# Patient Record
Sex: Female | Born: 1975 | Race: White | Hispanic: No | Marital: Married | Smoking: Current some day smoker
Health system: Southern US, Community
[De-identification: ages and names within clinical notes are randomized; demographics above are authoritative.]

## PROBLEM LIST (undated history)

## (undated) ENCOUNTER — Emergency Department (HOSPITAL_COMMUNITY): Admission: EM | Payer: Self-pay | Source: Home / Self Care

## (undated) DIAGNOSIS — Z5189 Encounter for other specified aftercare: Secondary | ICD-10-CM

## (undated) DIAGNOSIS — J45909 Unspecified asthma, uncomplicated: Secondary | ICD-10-CM

## (undated) DIAGNOSIS — K274 Chronic or unspecified peptic ulcer, site unspecified, with hemorrhage: Secondary | ICD-10-CM

## (undated) DIAGNOSIS — K284 Chronic or unspecified gastrojejunal ulcer with hemorrhage: Secondary | ICD-10-CM

## (undated) DIAGNOSIS — K449 Diaphragmatic hernia without obstruction or gangrene: Secondary | ICD-10-CM

## (undated) DIAGNOSIS — I10 Essential (primary) hypertension: Secondary | ICD-10-CM

## (undated) DIAGNOSIS — D649 Anemia, unspecified: Secondary | ICD-10-CM

## (undated) HISTORY — PX: ABDOMINAL HYSTERECTOMY: SHX81

## (undated) HISTORY — PX: TUBAL LIGATION: SHX77

---

## 1997-10-01 ENCOUNTER — Encounter: Admission: RE | Admit: 1997-10-01 | Discharge: 1997-12-30 | Payer: Self-pay | Admitting: Specialist

## 1999-04-27 ENCOUNTER — Other Ambulatory Visit: Admission: RE | Admit: 1999-04-27 | Discharge: 1999-04-27 | Payer: Self-pay | Admitting: Gynecology

## 2000-05-08 ENCOUNTER — Emergency Department (HOSPITAL_COMMUNITY): Admission: EM | Admit: 2000-05-08 | Discharge: 2000-05-08 | Payer: Self-pay | Admitting: Emergency Medicine

## 2000-05-08 ENCOUNTER — Encounter: Payer: Self-pay | Admitting: Emergency Medicine

## 2001-03-15 ENCOUNTER — Encounter: Payer: Self-pay | Admitting: Emergency Medicine

## 2001-03-15 ENCOUNTER — Emergency Department (HOSPITAL_COMMUNITY): Admission: EM | Admit: 2001-03-15 | Discharge: 2001-03-15 | Payer: Self-pay | Admitting: Emergency Medicine

## 2016-04-07 ENCOUNTER — Emergency Department (HOSPITAL_COMMUNITY): Payer: BLUE CROSS/BLUE SHIELD

## 2016-04-07 ENCOUNTER — Encounter (HOSPITAL_COMMUNITY): Payer: Self-pay

## 2016-04-07 ENCOUNTER — Emergency Department (HOSPITAL_COMMUNITY)
Admission: EM | Admit: 2016-04-07 | Discharge: 2016-04-07 | Disposition: A | Discharge status: Home / Self Care | Payer: BLUE CROSS/BLUE SHIELD | Attending: Emergency Medicine | Admitting: Emergency Medicine

## 2016-04-07 DIAGNOSIS — R1084 Generalized abdominal pain: Secondary | ICD-10-CM

## 2016-04-07 DIAGNOSIS — F17290 Nicotine dependence, other tobacco product, uncomplicated: Secondary | ICD-10-CM | POA: Diagnosis not present

## 2016-04-07 DIAGNOSIS — R5383 Other fatigue: Secondary | ICD-10-CM

## 2016-04-07 DIAGNOSIS — Z79899 Other long term (current) drug therapy: Secondary | ICD-10-CM | POA: Diagnosis not present

## 2016-04-07 DIAGNOSIS — I10 Essential (primary) hypertension: Secondary | ICD-10-CM | POA: Diagnosis not present

## 2016-04-07 DIAGNOSIS — K921 Melena: Secondary | ICD-10-CM | POA: Diagnosis not present

## 2016-04-07 DIAGNOSIS — E876 Hypokalemia: Secondary | ICD-10-CM | POA: Insufficient documentation

## 2016-04-07 DIAGNOSIS — J45909 Unspecified asthma, uncomplicated: Secondary | ICD-10-CM | POA: Diagnosis not present

## 2016-04-07 HISTORY — DX: Chronic or unspecified peptic ulcer, site unspecified, with hemorrhage: K27.4

## 2016-04-07 HISTORY — DX: Anemia, unspecified: D64.9

## 2016-04-07 HISTORY — DX: Chronic or unspecified gastrojejunal ulcer with hemorrhage: K28.4

## 2016-04-07 HISTORY — DX: Encounter for other specified aftercare: Z51.89

## 2016-04-07 HISTORY — DX: Unspecified asthma, uncomplicated: J45.909

## 2016-04-07 HISTORY — DX: Diaphragmatic hernia without obstruction or gangrene: K44.9

## 2016-04-07 HISTORY — DX: Essential (primary) hypertension: I10

## 2016-04-07 LAB — COMPREHENSIVE METABOLIC PANEL
ALBUMIN: 4.1 g/dL (ref 3.5–5.0)
ALK PHOS: 64 U/L (ref 38–126)
ALT: 20 U/L (ref 14–54)
ANION GAP: 7 (ref 5–15)
AST: 21 U/L (ref 15–41)
BILIRUBIN TOTAL: 0.4 mg/dL (ref 0.3–1.2)
BUN: 17 mg/dL (ref 6–20)
CALCIUM: 9.1 mg/dL (ref 8.9–10.3)
CO2: 28 mmol/L (ref 22–32)
Chloride: 102 mmol/L (ref 101–111)
Creatinine, Ser: 0.96 mg/dL (ref 0.44–1.00)
GFR calc non Af Amer: >60 mL/min (ref >60)
GLUCOSE: 113 mg/dL — AB (ref 65–99)
POTASSIUM: 3.1 mmol/L — AB (ref 3.5–5.1)
SODIUM: 137 mmol/L (ref 135–145)
TOTAL PROTEIN: 7.9 g/dL (ref 6.5–8.1)

## 2016-04-07 LAB — CBC
HEMATOCRIT: 39.3 % (ref 36.0–46.0)
HEMOGLOBIN: 12.6 g/dL (ref 12.0–15.0)
MCH: 27.5 pg (ref 26.0–34.0)
MCHC: 32.1 g/dL (ref 30.0–36.0)
MCV: 85.6 fL (ref 78.0–100.0)
Platelets: 332 10*3/uL (ref 150–400)
RBC: 4.59 MIL/uL (ref 3.87–5.11)
RDW: 14.3 % (ref 11.5–15.5)
WBC: 9.3 10*3/uL (ref 4.0–10.5)

## 2016-04-07 LAB — URINALYSIS, ROUTINE W REFLEX MICROSCOPIC
BILIRUBIN URINE: NEGATIVE
GLUCOSE, UA: NEGATIVE mg/dL
HGB URINE DIPSTICK: NEGATIVE
KETONES UR: NEGATIVE mg/dL
Leukocytes, UA: NEGATIVE
Nitrite: NEGATIVE
PROTEIN: NEGATIVE mg/dL
Specific Gravity, Urine: 1.013 (ref 1.005–1.030)
pH: 6.5 (ref 5.0–8.0)

## 2016-04-07 LAB — ABO/RH: ABO/RH(D): A NEG

## 2016-04-07 LAB — LIPASE, BLOOD: LIPASE: 30 U/L (ref 11–51)

## 2016-04-07 LAB — POC OCCULT BLOOD, ED: FECAL OCCULT BLD: NEGATIVE

## 2016-04-07 LAB — TYPE AND SCREEN
ABO/RH(D): A NEG
ANTIBODY SCREEN: NEGATIVE

## 2016-04-07 MED ORDER — POTASSIUM CHLORIDE CRYS ER 20 MEQ PO TBCR
40.0000 meq | EXTENDED_RELEASE_TABLET | Freq: Once | ORAL | Status: AC
  Administered 2016-04-07: 40 meq via ORAL
  Filled 2016-04-07: qty 2

## 2016-04-07 MED ORDER — IOPAMIDOL (ISOVUE-300) INJECTION 61%
100.0000 mL | Freq: Once | INTRAVENOUS | Status: AC | PRN
  Administered 2016-04-07: 100 mL via INTRAVENOUS

## 2016-04-07 MED ORDER — POTASSIUM CHLORIDE ER 10 MEQ PO TBCR: 20.0000 meq | EXTENDED_RELEASE_TABLET | Freq: Every day | ORAL | 0 refills | Status: DC

## 2016-04-07 MED ORDER — SODIUM CHLORIDE 0.9 % IV BOLUS (SEPSIS)
1000.0000 mL | Freq: Once | INTRAVENOUS | Status: AC
  Administered 2016-04-07: 1000 mL via INTRAVENOUS

## 2016-04-07 NOTE — ED Notes (Signed)
Pt states she is very dizzy and is unable to walk to bathroom. STEDY used

## 2016-04-07 NOTE — ED Provider Notes (Signed)
[facility]-EMERGENCY DEPT Provider Note   CSN: [id number] Arrival date & time: 04/07/16  1433     History   Chief Complaint Chief Complaint  Patient presents with  . Abdominal Pain  . Weakness  . Dizziness    HPI Carmen Ramos is a 40 y.o. female.  The history is provided by the patient.  Abdominal Pain   This is a recurrent problem. The current episode started more than 2 days ago. The problem occurs constantly (fluctuating). The problem has not changed since onset.The pain is located in the generalized abdominal region. The pain is moderate. Associated symptoms include melena. Pertinent negatives include anorexia, fever, diarrhea, flatus, hematochezia, vomiting, constipation, dysuria, frequency, hematuria, arthralgias and myalgias. Nothing aggravates the symptoms. Nothing relieves the symptoms. Past workup comments: Known bleeding ulcers on iron supplement. Patient is supposedly on protonic however recently moved to [place] and ran out of her prescription. Has been out of protonic for about 3 weeks.. Her past medical history is significant for PUD.  Weakness  Primary symptoms include dizziness. Pertinent negatives include no shortness of breath, no chest pain and no vomiting.  Dizziness  Associated symptoms: weakness   Associated symptoms: no chest pain, no diarrhea, no palpitations, no shortness of breath and no vomiting     Past Medical History:  Diagnosis Date  . Anemia   . Asthma   . Bleeding ulcer   . Blood transfusion without reported diagnosis    6  . Hiatal hernia   . Hypertension     There are no active problems to display for this patient.   Past Surgical History:  Procedure Laterality Date  . ABDOMINAL HYSTERECTOMY    . CESAREAN SECTION    . TUBAL LIGATION      OB History    No data available       Home Medications    Prior to Admission medications   Medication Sig Start Date End Date Taking? Authorizing Provider  Cyanocobalamin (VITAMIN  B-12 PO) Take 1 tablet by mouth daily.   Yes Historical Provider, MD  ferrous sulfate 325 (65 FE) MG tablet Take 325 mg by mouth 3 (three) times daily with meals.   Yes Historical Provider, MD  lisinopril-hydrochlorothiazide (PRINZIDE,ZESTORETIC) 20-25 MG tablet Take 1 tablet by mouth daily.   Yes Historical Provider, MD  pantoprazole (PROTONIX) 40 MG tablet Take 40 mg by mouth 2 (two) times daily.   Yes Historical Provider, MD  potassium chloride (K-DUR) 10 MEQ tablet Take 2 tablets (20 mEq total) by mouth daily. 04/07/16 04/14/16  Nira Conn, MD    Family History No family history on file.  Social History Social History  Substance Use Topics  . Smoking status: Current Some Day Smoker    Types: Cigars  . Smokeless tobacco: Never Used  . Alcohol use No     Allergies   Gluten meal and Latex   Review of Systems Review of Systems  Constitutional: Positive for fatigue. Negative for chills and fever.  HENT: Negative for ear pain and sore throat.   Eyes: Negative for pain and visual disturbance.  Respiratory: Negative for cough and shortness of breath.   Cardiovascular: Negative for chest pain and palpitations.  Gastrointestinal: Positive for abdominal pain and melena. Negative for anorexia, constipation, diarrhea, flatus, hematochezia and vomiting.  Genitourinary: Negative for dysuria, frequency and hematuria.  Musculoskeletal: Negative for arthralgias, back pain and myalgias.  Skin: Negative for color change and rash.  Neurological: Positive for dizziness and weakness. Negative  for seizures and syncope.  All other systems reviewed and are negative.    Physical Exam Updated Vital Signs BP 114/90 (BP Location: Right Arm)   Pulse 98   Temp 98.3 F (36.8 C) (Oral)   Resp 18   Ht 5\' 3"  (1.6 m)   Wt 248 lb (112.5 kg)   SpO2 99%   BMI 43.93 kg/m   Physical Exam  Constitutional: She is oriented to person, place, and time. She appears well-developed and  well-nourished. No distress.  HENT:  Head: Normocephalic and atraumatic.  Nose: Nose normal.  Mouth/Throat: Mucous membranes are dry.  Eyes: Conjunctivae and EOM are normal. Pupils are equal, round, and reactive to light. Right eye exhibits no discharge. Left eye exhibits no discharge. No scleral icterus.  Neck: Normal range of motion. Neck supple.  Cardiovascular: Normal rate and regular rhythm.  Exam reveals no gallop and no friction rub.   No murmur heard. Pulmonary/Chest: Effort normal and breath sounds normal. No stridor. No respiratory distress. She has no rales.  Abdominal: Soft. She exhibits no distension. There is generalized tenderness. There is guarding. There is no rigidity, no rebound and no CVA tenderness.  Musculoskeletal: She exhibits no edema or tenderness.  Neurological: She is alert and oriented to person, place, and time.  Skin: Skin is warm and dry. No rash noted. She is not diaphoretic. No erythema.  Psychiatric: She has a normal mood and affect.  Vitals reviewed.    ED Treatments / Results  Labs (all labs ordered are listed, but only abnormal results are displayed) Labs Reviewed  COMPREHENSIVE METABOLIC PANEL - Abnormal; Notable for the following:       Result Value   Potassium 3.1 (*)    Glucose, Bld 113 (*)    All other components within normal limits  CBC  LIPASE, BLOOD  URINALYSIS, ROUTINE W REFLEX MICROSCOPIC (NOT AT [facility])  POC OCCULT BLOOD, ED  TYPE AND SCREEN  ABO/RH    EKG  EKG Interpretation None       Radiology Ct Abdomen Pelvis W Contrast  Result Date: 04/07/2016 CLINICAL DATA:  Cramping abdominal pain, weakness and lethargy. EXAM: CT ABDOMEN AND PELVIS WITH CONTRAST TECHNIQUE: Multidetector CT imaging of the abdomen and pelvis was performed using the standard protocol following bolus administration of intravenous contrast. CONTRAST:  ISOVUE-300 IOPAMIDOL (ISOVUE-300) INJECTION 61% COMPARISON:  None. FINDINGS: LOWER CHEST: Lung  bases are clear. Included heart size is normal. No pericardial effusion. Moderate-sized hiatal hernia. HEPATOBILIARY: Liver and gallbladder are normal. PANCREAS: Normal. SPLEEN: Normal. ADRENALS/URINARY TRACT: Kidneys are orthotopic, demonstrating symmetric enhancement. No nephrolithiasis, hydronephrosis or solid renal masses. The unopacified ureters are normal in course and caliber. Delayed imaging through the kidneys demonstrates symmetric prompt contrast excretion within the proximal urinary collecting system. Urinary bladder is partially distended and unremarkable. Normal adrenal glands. STOMACH/BOWEL: The stomach, small and large bowel are normal in course and caliber without inflammatory changes. Scattered diverticulosis without acute diverticulitis. Appendix is not visualized but no pericecal inflammatory process identified. VASCULAR/LYMPHATIC: Aortoiliac vessels are normal in course and caliber. No lymphadenopathy by CT size criteria. REPRODUCTIVE: Hysterectomy.  No adnexal abnormality. OTHER: No intraperitoneal free fluid or free air. MUSCULOSKELETAL: L4-5 facet arthropathy. IMPRESSION: Moderate-sized hiatal hernia. L4-5 degenerative facet arthropathy. Hysterectomy. Electronically Signed   By: Tollie Eth M.D.   On: 04/07/2016 18:48    Procedures Procedures (including critical care time)  Medications Ordered in ED Medications  potassium chloride SA (K-DUR,KLOR-CON) CR tablet 40 mEq (not administered)  sodium chloride 0.9 % bolus 1,000 mL (0 mLs Intravenous Stopped 04/07/16 2003)  iopamidol (ISOVUE-300) 61 % injection 100 mL (100 mLs Intravenous Contrast Given 04/07/16 1830)     Initial Impression / Assessment and Plan / ED Course  I have reviewed the triage vital signs and the nursing notes.  Pertinent labs & imaging results that were available during my care of the patient were reviewed by me and considered in my medical decision making (see chart for details).  Clinical Course    Labs  without anemia. CMP with mild hypokalemia. Patient given oral replacement. No other electrolyte derangements. No evidence of hypoglycemia. No infectious symptoms on history. CT abdomen was obtained given the patient's diffuse abdominal tenderness to rule out possible perforation. CT unremarkable for any inflammatory infectious process.  Negative orthostatics. Patient given IV fluids which had some improvement in her fatigue.  Patient reports that she is already has an appointment with hematology in 2 weeks for her chronic iron deficiency anemia. Patient needs GI follow-up for her reported peptic ulcer disease.  Safe for discharge with strict return precautions.  Final Clinical Impressions(s) / ED Diagnoses   Final diagnoses:  Hypokalemia  Other fatigue  Generalized abdominal pain   Disposition: Discharge  Condition: Good  I have discussed the results, Dx and Tx plan with the patient who expressed understanding and agree(s) with the plan. Discharge instructions discussed at great length. The patient was given strict return precautions who verbalized understanding of the instructions. No further questions at time of discharge.    New Prescriptions   POTASSIUM CHLORIDE (K-DUR) 10 MEQ TABLET    Take 2 tablets (20 mEq total) by mouth daily.    Follow Up: [facility] [address] [place] [id number]  Schedule an appointment as soon as possible for a visit  For close follow up to assess for reported peptic ulcers  [facility] [address] [place] [id number] [phone] Call  For help establishing care with a care provider      Nira ConnPedro Eduardo Cardama, MD 04/07/16 2015

## 2016-04-07 NOTE — ED Notes (Signed)
MD at bedside. EDP PEDRO 

## 2016-04-07 NOTE — ED Notes (Signed)
Pt near syncope with blood draw. Pt transferred to room. Carmen Ramos collecting VS and assessment at present time.

## 2016-04-07 NOTE — ED Notes (Signed)
Pt did fine for lab draw -  Became very light headed right after.  Cool rag placed on back of neck and RN notified.  Pt friend states that she has been very week for a few days now.

## 2016-04-07 NOTE — ED Notes (Signed)
Pt reports blood in emesis is both bright red and coffee ground in color.

## 2016-04-07 NOTE — ED Notes (Signed)
Patient d/c'd self care.  F/U and medications reviewed.  Patient verbalized understanding. 

## 2016-04-07 NOTE — ED Notes (Signed)
Carmen Ramos HUSBAND CONTACT INFORMATION CELL # [phone]

## 2016-04-07 NOTE — Progress Notes (Signed)
Patient listed as having [employer] insurance without a pcp.  EDCM spoke to patient at bedside.  [place]  EDCm provided patient with a list of providers who accept [employer] insurance within with a 20 mile radius of patient's zip code.  Encouraged patient to establish care as soon as possible.  Patient thankful for resources.  No further EDCM needs at this time.

## 2016-04-07 NOTE — ED Triage Notes (Signed)
Pt presents to the ED c/o cramping abd pain, weakness, lethargy, and dizziness x 3 days. Pt also reports feeling shaky, tachycardic, and had an episode of hematemesis. Pt reports hx of bleeding ulcers and states "whenever she feels like this she has ended up getting a blood transfusion". Denies SOB or CP. Pulses 2+, cap refill <3 secs.

## 2016-04-26 DIAGNOSIS — F419 Anxiety disorder, unspecified: Secondary | ICD-10-CM | POA: Insufficient documentation

## 2016-08-17 DIAGNOSIS — K219 Gastro-esophageal reflux disease without esophagitis: Secondary | ICD-10-CM | POA: Insufficient documentation

## 2016-12-31 ENCOUNTER — Encounter (HOSPITAL_COMMUNITY): Payer: Self-pay | Admitting: Emergency Medicine

## 2016-12-31 ENCOUNTER — Emergency Department (HOSPITAL_COMMUNITY): Payer: BLUE CROSS/BLUE SHIELD

## 2016-12-31 ENCOUNTER — Emergency Department (HOSPITAL_COMMUNITY)
Admission: EM | Admit: 2016-12-31 | Discharge: 2016-12-31 | Disposition: A | Discharge status: Home / Self Care | Payer: BLUE CROSS/BLUE SHIELD | Attending: Emergency Medicine | Admitting: Emergency Medicine

## 2016-12-31 ENCOUNTER — Other Ambulatory Visit: Payer: Self-pay

## 2016-12-31 DIAGNOSIS — I10 Essential (primary) hypertension: Secondary | ICD-10-CM | POA: Insufficient documentation

## 2016-12-31 DIAGNOSIS — R42 Dizziness and giddiness: Secondary | ICD-10-CM | POA: Diagnosis not present

## 2016-12-31 DIAGNOSIS — Z79899 Other long term (current) drug therapy: Secondary | ICD-10-CM | POA: Insufficient documentation

## 2016-12-31 DIAGNOSIS — M791 Myalgia: Secondary | ICD-10-CM | POA: Diagnosis not present

## 2016-12-31 DIAGNOSIS — R109 Unspecified abdominal pain: Secondary | ICD-10-CM | POA: Insufficient documentation

## 2016-12-31 DIAGNOSIS — R002 Palpitations: Secondary | ICD-10-CM | POA: Diagnosis not present

## 2016-12-31 DIAGNOSIS — F17290 Nicotine dependence, other tobacco product, uncomplicated: Secondary | ICD-10-CM | POA: Diagnosis not present

## 2016-12-31 DIAGNOSIS — R0789 Other chest pain: Secondary | ICD-10-CM

## 2016-12-31 DIAGNOSIS — J45909 Unspecified asthma, uncomplicated: Secondary | ICD-10-CM | POA: Diagnosis not present

## 2016-12-31 DIAGNOSIS — R5383 Other fatigue: Secondary | ICD-10-CM | POA: Diagnosis not present

## 2016-12-31 DIAGNOSIS — R110 Nausea: Secondary | ICD-10-CM | POA: Diagnosis not present

## 2016-12-31 LAB — BASIC METABOLIC PANEL
Anion gap: 12 (ref 5–15)
BUN: 18 mg/dL (ref 6–20)
CALCIUM: 9.5 mg/dL (ref 8.9–10.3)
CHLORIDE: 101 mmol/L (ref 101–111)
CO2: 26 mmol/L (ref 22–32)
CREATININE: 0.84 mg/dL (ref 0.44–1.00)
Glucose, Bld: 94 mg/dL (ref 65–99)
Potassium: 3.2 mmol/L — ABNORMAL LOW (ref 3.5–5.1)
SODIUM: 139 mmol/L (ref 135–145)

## 2016-12-31 LAB — CBC
HCT: 34.2 % — ABNORMAL LOW (ref 36.0–46.0)
Hemoglobin: 10.8 g/dL — ABNORMAL LOW (ref 12.0–15.0)
MCH: 24.7 pg — ABNORMAL LOW (ref 26.0–34.0)
MCHC: 31.6 g/dL (ref 30.0–36.0)
MCV: 78.1 fL (ref 78.0–100.0)
PLATELETS: 350 10*3/uL (ref 150–400)
RBC: 4.38 MIL/uL (ref 3.87–5.11)
RDW: 15.8 % — ABNORMAL HIGH (ref 11.5–15.5)
WBC: 10.3 10*3/uL (ref 4.0–10.5)

## 2016-12-31 LAB — POCT I-STAT TROPONIN I: TROPONIN I, POC: 0 ng/mL (ref 0.00–0.08)

## 2016-12-31 LAB — RAPID URINE DRUG SCREEN, HOSP PERFORMED
Amphetamines: NOT DETECTED
Barbiturates: NOT DETECTED
Benzodiazepines: NOT DETECTED
Cocaine: NOT DETECTED
Opiates: NOT DETECTED
Tetrahydrocannabinol: NOT DETECTED

## 2016-12-31 LAB — D-DIMER, QUANTITATIVE: D-Dimer, Quant: <0.27 ug/mL-FEU (ref 0.00–0.50)

## 2016-12-31 LAB — TSH: TSH: 0.912 u[IU]/mL (ref 0.350–4.500)

## 2016-12-31 MED ORDER — ASPIRIN 81 MG PO CHEW
324.0000 mg | CHEWABLE_TABLET | Freq: Once | ORAL | Status: DC
  Filled 2016-12-31: qty 4

## 2016-12-31 MED ORDER — GI COCKTAIL ~~LOC~~
30.0000 mL | Freq: Once | ORAL | Status: AC
  Administered 2016-12-31: 30 mL via ORAL
  Filled 2016-12-31: qty 30

## 2016-12-31 MED ORDER — SODIUM CHLORIDE 0.9 % IV BOLUS (SEPSIS)
1000.0000 mL | Freq: Once | INTRAVENOUS | Status: AC
  Administered 2016-12-31: 1000 mL via INTRAVENOUS

## 2016-12-31 NOTE — Discharge Instructions (Signed)
Continue taking your medications as prescribed. Apply ice or heat to painful areas for comfort. Take hot showers or baths with gentle stretching for muscle relaxation. Follow-up with your primary care physician for reevaluation and possible Holter monitor. Return to the ED if any concerning signs or symptoms develop

## 2016-12-31 NOTE — ED Notes (Signed)
Provider at bedside

## 2016-12-31 NOTE — ED Triage Notes (Addendum)
Patient reports over past 2 weeks having heart palpitations and mid to left chest pain.  Patient states that she also has lower left abd pain with nausea. Patient reports that she is anemic and been battling with iron supplements to keep her counts up.

## 2016-12-31 NOTE — ED Notes (Signed)
Pt reported that during orthostatic vital signs she felt like the room went black and was spinning. Tech at bedside advised pt remained alert during episode.

## 2016-12-31 NOTE — ED Provider Notes (Signed)
[facility]-EMERGENCY DEPT Provider Note   CSN: [id number] Arrival date & time: 12/31/16  1820     History   Chief Complaint Chief Complaint  Patient presents with  . Palpitations  . Chest Pain    HPI Carmen Ramos is a 41 y.o. female with past medical history of anemia, asthma, GERD, HTN, and hiatal hernia who presents a with chief complaint gradual onset, worsening multiple episodes of heart palpitations with associated chest pain, abdominal pain, and nausea for 1 week. She states that she has reports of 10 episodes that last for a few minutes at a time in which she feels left-sided chest tightness followed by heart palpitations. At times this is accompanied by upper abdominal pain and nausea. No aggravating or alleviating factors noted. She has not tried anything for her symptoms. She states that she is chronically anemic, and has required blood transfusions in the past. She endorses increased fatigue and generalized myalgias as well as some lightheadedness with position changes. She denies recent travel or surgeries, hemoptysis, estrogen therapy, or prior history of DVT or PE.She is a nonsmoker.   HPI provided by the patient.   Past Medical History:  Diagnosis Date  . Anemia   . Asthma   . Bleeding ulcer   . Blood transfusion without reported diagnosis    6  . Hiatal hernia   . Hypertension     There are no active problems to display for this patient.   Past Surgical History:  Procedure Laterality Date  . ABDOMINAL HYSTERECTOMY    . CESAREAN SECTION    . TUBAL LIGATION      OB History    No data available       Home Medications    Prior to Admission medications   Medication Sig Start Date End Date Taking? Authorizing Provider  cetirizine (ZYRTEC) 10 MG tablet Take 10 mg by mouth daily as needed for allergies.   Yes [provider]  Cyanocobalamin (VITAMIN B-12 PO) Take 1 tablet by mouth daily.   Yes [provider]  ferrous sulfate 325 (65 FE) MG  tablet Take 325 mg by mouth 3 (three) times daily with meals.   Yes [provider]  lisinopril-hydrochlorothiazide (PRINZIDE,ZESTORETIC) 20-25 MG tablet Take 1 tablet by mouth daily.   Yes [provider]  pantoprazole (PROTONIX) 40 MG tablet Take 40 mg by mouth 2 (two) times daily.   Yes [provider]  Vitamin D, Ergocalciferol, (DRISDOL) 50000 units CAPS capsule Take 50,000 Units by mouth every 7 (seven) days.  12/06/16 03/06/17 Yes [provider]  potassium chloride (K-DUR) 10 MEQ tablet Take 2 tablets (20 mEq total) by mouth daily. 04/07/16 04/14/16  Nira Conn, MD    Family History No family history on file.  Social History Social History  Substance Use Topics  . Smoking status: Current Some Day Smoker    Types: Cigars  . Smokeless tobacco: Never Used  . Alcohol use No     Allergies   Gluten meal and Latex   Review of Systems Review of Systems  Constitutional: Positive for fatigue. Negative for chills, diaphoresis and fever.  Respiratory: Positive for chest tightness. Negative for shortness of breath.   Cardiovascular: Positive for chest pain and palpitations. Negative for leg swelling.  Gastrointestinal: Positive for abdominal pain and nausea. Negative for constipation and diarrhea.  Genitourinary: Negative for dysuria and hematuria.  Musculoskeletal: Positive for myalgias.  Neurological: Positive for weakness (generalized, chronic, unchanged) and light-headedness. Negative for  syncope.     Physical Exam Updated Vital Signs BP (!) 165/108 (BP Location: Left Arm)   Pulse (!) 106   Temp 98.5 F (36.9 C) (Oral)   Resp 18   Ht 5\' 3"  (1.6 m)   SpO2 98%   Physical Exam  Constitutional: She is oriented to person, place, and time. She appears well-developed and well-nourished. No distress.  Resting comfortably in bed  HENT:  Head: Normocephalic and atraumatic.  Eyes: Conjunctivae are normal. Pupils are equal, round, and  reactive to light. Right eye exhibits no discharge. Left eye exhibits no discharge. No scleral icterus.  Neck: Normal range of motion. Neck supple. No JVD present.  Cardiovascular: Regular rhythm and intact distal pulses.   Tachycardic, 2+ radial pulses bilaterally and 2+ DP/PT pulses, Homans negative bilaterally  Pulmonary/Chest: Effort normal and breath sounds normal. No respiratory distress. She exhibits tenderness.  Equal rise and fall of chest, no increased work of breathing. Generalized tenderness to palpation of the chest wall, maximally on the left side parasternally anteriorly  Abdominal: Soft. She exhibits no distension. There is tenderness. There is no rebound and no guarding.  Generalized tenderness to palpation of the abdomen, maximally in the upper abdomen. Murphy's absent, Rovsing's absent, no tenderness to palpation at McBurney's point  Musculoskeletal: She exhibits tenderness. She exhibits no edema.  Generalized mild ttp of extremities, back, and paraspinal regions. No deformity, crepitus, or step-off noted.   Lymphadenopathy:    She has no cervical adenopathy.  Neurological: She is alert and oriented to person, place, and time. No cranial nerve deficit or sensory deficit.  Fluent speech, no facial droop, sensation intact to soft touch globally, CN III-XII tested and intact  Skin: Skin is warm and dry. Capillary refill takes less than 2 seconds. She is not diaphoretic.  Psychiatric: She has a normal mood and affect. Her behavior is normal.     ED Treatments / Results  Labs (all labs ordered are listed, but only abnormal results are displayed) Labs Reviewed  BASIC METABOLIC PANEL - Abnormal; Notable for the following:       Result Value   Potassium 3.2 (*)    All other components within normal limits  CBC - Abnormal; Notable for the following:    Hemoglobin 10.8 (*)    HCT 34.2 (*)    MCH 24.7 (*)    RDW 15.8 (*)    All other components within normal limits  I-STAT  TROPOININ, ED    EKG  EKG Interpretation None       Radiology Dg Chest 2 View  Result Date: 12/31/2016 CLINICAL DATA:  41 year old female with 2 weeks of palpitations and chest pain radiating to the left. Left abdominal pain and nausea. EXAM: CHEST  2 VIEW COMPARISON:  None available. FINDINGS: Somewhat low lung volumes. Normal cardiac size and mediastinal contours. Visualized tracheal air column is within normal limits. No pneumothorax, pulmonary edema or pleural effusion. Mild streaky lung base opacity greater on the left. No other confluent pulmonary opacity. No pneumoperitoneum. Paucity of gas in the abdomen, but the visible gas pattern is unremarkable. No acute osseous abnormality identified. IMPRESSION: 1. Somewhat low lung volumes with mild streaky lung base opacity favored to be atelectasis. 2. Otherwise negative. Electronically Signed   By: Odessa Fleming M.D.   On: 12/31/2016 19:16    Procedures Procedures (including critical care time)  Medications Ordered in ED Medications - No data to display   Initial Impression / Assessment and Plan /  ED Course  I have reviewed the triage vital signs and the nursing notes.  Pertinent labs & imaging results that were available during my care of the patient were reviewed by me and considered in my medical decision making (see chart for details).     Patient with multiple episodes of chest tightness, palpitations, with associated abdominal pain and nausea. Afebrile, initially tachycardic and hypertensive on arrival to the ED, with resolution while in the ED. Troponin negative, initial EKG shows sinus tachycardia. Repeat EKG shows normal sinus rhythm. Chest pain reproducible on palpation. Heart score 2, Low suspicion of ACS/MI. Patient is not orthostatic. Hemoglobin slightly decreased at 10.8, this is not concerning for a symptomatic anemia. UDS negative. TSH within normal limits. D-dimer negative. Low suspicion of thyroid disorder, PE, or substance  abuse. No further emergent workup required at this time. On reevaluation, patient states that her abdominal symptoms improved significantly with GI cocktail; I suspect there is some component of her reflux and hiatal hernia contributing to her symptoms. Low suspicion of cholecystitis, hepatitis, pancreatitis, appendicitis, nephrolithiasis, or acute surgical abdominal pathology contributing to her symptoms. Recommend follow-up with primary care for reevaluation and possible Holter monitoring. Also discussed symptomatic treatments for musculoskeletal pain. Discussed indications for return to the ED. Pt verbalized understanding of and agreement with plan and is safe for discharge home at this time.   Final Clinical Impressions(s) / ED Diagnoses   Final diagnoses:  Palpitations Chest wall pain    New Prescriptions New Prescriptions   No medications on file     Bennye AlmFawze, Reshonda Koerber A, PA-C 01/01/17 0003    Jacalyn LefevreHaviland, Julie, MD 01/03/17 [phone]

## 2017-07-02 ENCOUNTER — Emergency Department (HOSPITAL_COMMUNITY)
Admission: EM | Admit: 2017-07-02 | Discharge: 2017-07-03 | Disposition: A | Discharge status: Home / Self Care | Payer: BLUE CROSS/BLUE SHIELD | Attending: Emergency Medicine | Admitting: Emergency Medicine

## 2017-07-02 ENCOUNTER — Encounter (HOSPITAL_COMMUNITY): Payer: Self-pay

## 2017-07-02 ENCOUNTER — Other Ambulatory Visit: Payer: Self-pay

## 2017-07-02 DIAGNOSIS — J45909 Unspecified asthma, uncomplicated: Secondary | ICD-10-CM | POA: Insufficient documentation

## 2017-07-02 DIAGNOSIS — Z79899 Other long term (current) drug therapy: Secondary | ICD-10-CM | POA: Diagnosis not present

## 2017-07-02 DIAGNOSIS — I10 Essential (primary) hypertension: Secondary | ICD-10-CM | POA: Insufficient documentation

## 2017-07-02 DIAGNOSIS — Z91040 Latex allergy status: Secondary | ICD-10-CM | POA: Insufficient documentation

## 2017-07-02 DIAGNOSIS — K5792 Diverticulitis of intestine, part unspecified, without perforation or abscess without bleeding: Secondary | ICD-10-CM

## 2017-07-02 DIAGNOSIS — F17210 Nicotine dependence, cigarettes, uncomplicated: Secondary | ICD-10-CM | POA: Insufficient documentation

## 2017-07-02 DIAGNOSIS — R109 Unspecified abdominal pain: Secondary | ICD-10-CM

## 2017-07-02 DIAGNOSIS — R1032 Left lower quadrant pain: Secondary | ICD-10-CM | POA: Insufficient documentation

## 2017-07-02 LAB — CBC
HEMATOCRIT: 35.6 % — AB (ref 36.0–46.0)
HEMOGLOBIN: 11.2 g/dL — AB (ref 12.0–15.0)
MCH: 24.8 pg — AB (ref 26.0–34.0)
MCHC: 31.5 g/dL (ref 30.0–36.0)
MCV: 78.8 fL (ref 78.0–100.0)
Platelets: 375 10*3/uL (ref 150–400)
RBC: 4.52 MIL/uL (ref 3.87–5.11)
RDW: 15.9 % — ABNORMAL HIGH (ref 11.5–15.5)
WBC: 15.1 10*3/uL — ABNORMAL HIGH (ref 4.0–10.5)

## 2017-07-02 LAB — I-STAT BETA HCG BLOOD, ED (MC, WL, AP ONLY)

## 2017-07-02 MED ORDER — HYDROMORPHONE HCL 1 MG/ML IJ SOLN
1.0000 mg | Freq: Once | INTRAMUSCULAR | Status: AC
  Administered 2017-07-02: 1 mg via INTRAVENOUS
  Filled 2017-07-02: qty 1

## 2017-07-02 MED ORDER — ONDANSETRON HCL 4 MG/2ML IJ SOLN
4.0000 mg | Freq: Once | INTRAMUSCULAR | Status: AC
  Administered 2017-07-02: 4 mg via INTRAVENOUS
  Filled 2017-07-02: qty 2

## 2017-07-02 NOTE — ED Provider Notes (Signed)
[facility]-EMERGENCY DEPT Provider Note   CSN: [id number] Arrival date & time: 07/02/17  1840     History   Chief Complaint Chief Complaint  Patient presents with  . Abdominal Pain    HPI Claude MangesMichelle L Weissinger is a 41 y.o. female.  HPI  Patient is a 41 year old female presents emergency department with left lower quadrant abdominal pain that began close to 24 hours ago.  Her pain is continued to worsen throughout the day.  She reports nausea without vomiting.  Denies diarrhea.  No blood in her stool.  Patient has never had pain like this before.  Some radiation of her pain towards her flank but otherwise her pain is rather constant.  Pain is moderate to severe in severity and worse with movement and palpation of her left lower quadrant.  No dysuria or urinary frequency.  No vaginal complaints.  Prior hysterectomy.  Past Medical History:  Diagnosis Date  . Anemia   . Asthma   . Bleeding ulcer   . Blood transfusion without reported diagnosis    6  . Hiatal hernia   . Hypertension     There are no active problems to display for this patient.   Past Surgical History:  Procedure Laterality Date  . ABDOMINAL HYSTERECTOMY    . CESAREAN SECTION    . TUBAL LIGATION      OB History    No data available       Home Medications    Prior to Admission medications   Medication Sig Start Date End Date Taking? Authorizing Provider  cetirizine (ZYRTEC) 10 MG tablet Take 10 mg by mouth daily as needed for allergies.    [provider]  Cyanocobalamin (VITAMIN B-12 PO) Take 1 tablet by mouth daily.    [provider]  ferrous sulfate 325 (65 FE) MG tablet Take 325 mg by mouth 3 (three) times daily with meals.    [provider]  lisinopril-hydrochlorothiazide (PRINZIDE,ZESTORETIC) 20-25 MG tablet Take 1 tablet by mouth daily.    [provider]  pantoprazole (PROTONIX) 40 MG tablet Take 40 mg by mouth 2 (two) times daily.     [provider]  potassium chloride (K-DUR) 10 MEQ tablet Take 2 tablets (20 mEq total) by mouth daily. 04/07/16 04/14/16  Nira Connardama, Pedro Eduardo, MD    Family History Family History  Problem Relation Age of Onset  . Cancer Mother   . Parkinson's disease Father   . Dementia Father     Social History Social History   Tobacco Use  . Smoking status: Current Some Day Smoker    Types: Cigars  . Smokeless tobacco: Never Used  Substance Use Topics  . Alcohol use: No  . Drug use: No     Allergies   Gluten meal and Latex   Review of Systems Review of Systems  All other systems reviewed and are negative.    Physical Exam Updated Vital Signs BP (!) 163/88 (BP Location: Left Arm)   Pulse (!) 119   Temp 98.1 F (36.7 C) (Oral)   Resp 18   Ht 5' 3.5" (1.613 m)   Wt 113.4 kg (250 lb)   SpO2 98%   BMI 43.59 kg/m   Physical Exam  Constitutional: She is oriented to person, place, and time. She appears well-developed and well-nourished. No distress.  HENT:  Head: Normocephalic and atraumatic.  Eyes: EOM are normal.  Neck: Normal range of motion.  Cardiovascular: Normal rate, regular rhythm and  normal heart sounds.  Pulmonary/Chest: Effort normal and breath sounds normal.  Abdominal: Soft. She exhibits no distension.  Left lower quadrant tenderness  Musculoskeletal: Normal range of motion.  Neurological: She is alert and oriented to person, place, and time.  Skin: Skin is warm and dry.  Psychiatric: She has a normal mood and affect. Judgment normal.  Nursing note and vitals reviewed.    ED Treatments / Results  Labs (all labs ordered are listed, but only abnormal results are displayed) Labs Reviewed  COMPREHENSIVE METABOLIC PANEL - Abnormal; Notable for the following components:      Result Value   Potassium 3.1 (*)    Chloride 99 (*)    Glucose, Bld 109 (*)    Total Protein 8.2 (*)    All other components within normal limits  CBC - Abnormal; Notable  for the following components:   WBC 15.1 (*)    Hemoglobin 11.2 (*)    HCT 35.6 (*)    MCH 24.8 (*)    RDW 15.9 (*)    All other components within normal limits  URINALYSIS, ROUTINE W REFLEX MICROSCOPIC - Abnormal; Notable for the following components:   Color, Urine STRAW (*)    All other components within normal limits  LIPASE, BLOOD  I-STAT BETA HCG BLOOD, ED ([facility], [facility], [facility] ONLY)    EKG  EKG Interpretation None       Radiology Ct Renal Stone Study  Result Date: 07/03/2017 CLINICAL DATA:  41 y/o  F; flank pain, stone disease suspected. EXAM: CT ABDOMEN AND PELVIS WITHOUT CONTRAST TECHNIQUE: Multidetector CT imaging of the abdomen and pelvis was performed following the standard protocol without IV contrast. COMPARISON:  None. FINDINGS: Lower chest: No acute abnormality. Hepatobiliary: Hepatic steatosis. No focal liver abnormality is seen. No gallstones, gallbladder wall thickening, or biliary dilatation. Pancreas: Unremarkable. No pancreatic ductal dilatation or surrounding inflammatory changes. Spleen: Normal in size without focal abnormality. Adrenals/Urinary Tract: Adrenal glands are unremarkable. Kidneys are normal, without renal calculi, focal lesion, or hydronephrosis. Bladder is unremarkable. Stomach/Bowel: Acute sigmoid diverticulitis. No perforation or abscess. Otherwise negative. Vascular/Lymphatic: No significant vascular findings are present. No enlarged abdominal or pelvic lymph nodes. Reproductive: Uterus and bilateral adnexa are unremarkable. Other: Small paraumbilical hiatal hernia containing fat. Musculoskeletal: No fracture is seen. Mild lumbar spondylosis with prominent facet arthrosis. IMPRESSION: 1. Acute sigmoid diverticulitis.  No perforation or abscess. 2. Hepatic steatosis. Electronically Signed   By: Mitzi HansenLance  Furusawa-Stratton M.D.   On: 07/03/2017 00:50    Procedures Procedures (including critical care time)  Medications Ordered in ED Medications - No data to  display   Initial Impression / Assessment and Plan / ED Course  I have reviewed the triage vital signs and the nursing notes.  Pertinent labs & imaging results that were available during my care of the patient were reviewed by me and considered in my medical decision making (see chart for details).     2:30 AM Acute diverticulitis.  Improvement in symptoms.  Patient understands return to the ER for new or worsening symptoms.  Uncomplicated at this time  Final Clinical Impressions(s) / ED Diagnoses   Final diagnoses:  Acute diverticulitis  Acute abdominal pain    ED Discharge Orders    None       Azalia Bilisampos, Jacquiline Zurcher, MD 07/03/17 0230

## 2017-07-02 NOTE — ED Triage Notes (Signed)
Patient c/o left mid abdominal pain and nausea that started at 0400. Patient c/o dizziness at 1400 today.

## 2017-07-03 ENCOUNTER — Emergency Department (HOSPITAL_COMMUNITY): Payer: BLUE CROSS/BLUE SHIELD

## 2017-07-03 LAB — COMPREHENSIVE METABOLIC PANEL
ALBUMIN: 3.9 g/dL (ref 3.5–5.0)
ALK PHOS: 67 U/L (ref 38–126)
ALT: 20 U/L (ref 14–54)
AST: 21 U/L (ref 15–41)
Anion gap: 9 (ref 5–15)
BUN: 12 mg/dL (ref 6–20)
CALCIUM: 9.3 mg/dL (ref 8.9–10.3)
CHLORIDE: 99 mmol/L — AB (ref 101–111)
CO2: 28 mmol/L (ref 22–32)
CREATININE: 0.67 mg/dL (ref 0.44–1.00)
GFR calc non Af Amer: >60 mL/min (ref >60)
GLUCOSE: 109 mg/dL — AB (ref 65–99)
Potassium: 3.1 mmol/L — ABNORMAL LOW (ref 3.5–5.1)
SODIUM: 136 mmol/L (ref 135–145)
Total Bilirubin: 0.3 mg/dL (ref 0.3–1.2)
Total Protein: 8.2 g/dL — ABNORMAL HIGH (ref 6.5–8.1)

## 2017-07-03 LAB — URINALYSIS, ROUTINE W REFLEX MICROSCOPIC
Bilirubin Urine: NEGATIVE
GLUCOSE, UA: NEGATIVE mg/dL
Hgb urine dipstick: NEGATIVE
Ketones, ur: NEGATIVE mg/dL
LEUKOCYTES UA: NEGATIVE
NITRITE: NEGATIVE
PH: 6 (ref 5.0–8.0)
Protein, ur: NEGATIVE mg/dL
SPECIFIC GRAVITY, URINE: 1.011 (ref 1.005–1.030)

## 2017-07-03 LAB — LIPASE, BLOOD: LIPASE: 29 U/L (ref 11–51)

## 2017-07-03 MED ORDER — OXYCODONE-ACETAMINOPHEN 5-325 MG PO TABS: 1.0000 | ORAL_TABLET | ORAL | 0 refills | Status: DC | PRN

## 2017-07-03 MED ORDER — ONDANSETRON 8 MG PO TBDP: 8.0000 mg | ORAL_TABLET | Freq: Three times a day (TID) | ORAL | 0 refills | Status: DC | PRN

## 2017-07-03 MED ORDER — CIPROFLOXACIN HCL 500 MG PO TABS
500.0000 mg | ORAL_TABLET | Freq: Once | ORAL | Status: AC
  Administered 2017-07-03: 500 mg via ORAL
  Filled 2017-07-03: qty 1

## 2017-07-03 MED ORDER — OXYCODONE-ACETAMINOPHEN 5-325 MG PO TABS
2.0000 | ORAL_TABLET | Freq: Once | ORAL | Status: AC
  Administered 2017-07-03: 2 via ORAL
  Filled 2017-07-03: qty 2

## 2017-07-03 MED ORDER — METRONIDAZOLE 500 MG PO TABS: 500.0000 mg | ORAL_TABLET | Freq: Two times a day (BID) | ORAL | 0 refills | Status: DC

## 2017-07-03 MED ORDER — METRONIDAZOLE 500 MG PO TABS
500.0000 mg | ORAL_TABLET | Freq: Once | ORAL | Status: AC
  Administered 2017-07-03: 500 mg via ORAL
  Filled 2017-07-03: qty 1

## 2017-07-03 MED ORDER — CIPROFLOXACIN HCL 500 MG PO TABS: 500.0000 mg | ORAL_TABLET | Freq: Two times a day (BID) | ORAL | 0 refills | Status: DC

## 2017-12-24 DIAGNOSIS — F4323 Adjustment disorder with mixed anxiety and depressed mood: Secondary | ICD-10-CM | POA: Insufficient documentation

## 2019-09-12 ENCOUNTER — Encounter (HOSPITAL_COMMUNITY): Payer: Self-pay

## 2019-09-12 ENCOUNTER — Inpatient Hospital Stay (HOSPITAL_COMMUNITY)
Admission: EM | Admit: 2019-09-12 | Discharge: 2019-09-14 | DRG: 381 | Disposition: A | Discharge status: Home / Self Care | Payer: BC Managed Care – PPO | Attending: Internal Medicine | Admitting: Internal Medicine

## 2019-09-12 ENCOUNTER — Other Ambulatory Visit: Payer: Self-pay

## 2019-09-12 ENCOUNTER — Emergency Department (HOSPITAL_COMMUNITY): Payer: BC Managed Care – PPO

## 2019-09-12 DIAGNOSIS — K449 Diaphragmatic hernia without obstruction or gangrene: Secondary | ICD-10-CM

## 2019-09-12 DIAGNOSIS — Z91040 Latex allergy status: Secondary | ICD-10-CM

## 2019-09-12 DIAGNOSIS — F17290 Nicotine dependence, other tobacco product, uncomplicated: Secondary | ICD-10-CM | POA: Diagnosis present

## 2019-09-12 DIAGNOSIS — K2210 Ulcer of esophagus without bleeding: Secondary | ICD-10-CM | POA: Diagnosis not present

## 2019-09-12 DIAGNOSIS — D62 Acute posthemorrhagic anemia: Secondary | ICD-10-CM

## 2019-09-12 DIAGNOSIS — J45901 Unspecified asthma with (acute) exacerbation: Secondary | ICD-10-CM | POA: Diagnosis not present

## 2019-09-12 DIAGNOSIS — J45909 Unspecified asthma, uncomplicated: Secondary | ICD-10-CM | POA: Diagnosis present

## 2019-09-12 DIAGNOSIS — Z79899 Other long term (current) drug therapy: Secondary | ICD-10-CM

## 2019-09-12 DIAGNOSIS — D500 Iron deficiency anemia secondary to blood loss (chronic): Secondary | ICD-10-CM | POA: Diagnosis not present

## 2019-09-12 DIAGNOSIS — Z20822 Contact with and (suspected) exposure to covid-19: Secondary | ICD-10-CM | POA: Diagnosis present

## 2019-09-12 DIAGNOSIS — Z8711 Personal history of peptic ulcer disease: Secondary | ICD-10-CM

## 2019-09-12 DIAGNOSIS — Z91018 Allergy to other foods: Secondary | ICD-10-CM

## 2019-09-12 DIAGNOSIS — K922 Gastrointestinal hemorrhage, unspecified: Secondary | ICD-10-CM | POA: Diagnosis not present

## 2019-09-12 DIAGNOSIS — K921 Melena: Secondary | ICD-10-CM | POA: Diagnosis not present

## 2019-09-12 DIAGNOSIS — Z820 Family history of epilepsy and other diseases of the nervous system: Secondary | ICD-10-CM

## 2019-09-12 DIAGNOSIS — I10 Essential (primary) hypertension: Secondary | ICD-10-CM

## 2019-09-12 DIAGNOSIS — Z6841 Body Mass Index (BMI) 40.0 and over, adult: Secondary | ICD-10-CM

## 2019-09-12 DIAGNOSIS — Z888 Allergy status to other drugs, medicaments and biological substances status: Secondary | ICD-10-CM

## 2019-09-12 DIAGNOSIS — R42 Dizziness and giddiness: Secondary | ICD-10-CM | POA: Diagnosis present

## 2019-09-12 DIAGNOSIS — E669 Obesity, unspecified: Secondary | ICD-10-CM | POA: Diagnosis present

## 2019-09-12 DIAGNOSIS — Z8616 Personal history of COVID-19: Secondary | ICD-10-CM

## 2019-09-12 DIAGNOSIS — R7989 Other specified abnormal findings of blood chemistry: Secondary | ICD-10-CM | POA: Diagnosis present

## 2019-09-12 LAB — COMPREHENSIVE METABOLIC PANEL
ALT: 17 U/L (ref 0–44)
AST: 14 U/L — ABNORMAL LOW (ref 15–41)
Albumin: 4.2 g/dL (ref 3.5–5.0)
Alkaline Phosphatase: 58 U/L (ref 38–126)
Anion gap: 11 (ref 5–15)
BUN: 24 mg/dL — ABNORMAL HIGH (ref 6–20)
CO2: 22 mmol/L (ref 22–32)
Calcium: 9.2 mg/dL (ref 8.9–10.3)
Chloride: 103 mmol/L (ref 98–111)
Creatinine, Ser: 0.88 mg/dL (ref 0.44–1.00)
GFR calc Af Amer: >60 mL/min (ref >60)
GFR calc non Af Amer: >60 mL/min (ref >60)
Glucose, Bld: 91 mg/dL (ref 70–99)
Potassium: 3.8 mmol/L (ref 3.5–5.1)
Sodium: 136 mmol/L (ref 135–145)
Total Bilirubin: 0.3 mg/dL (ref 0.3–1.2)
Total Protein: 8.3 g/dL — ABNORMAL HIGH (ref 6.5–8.1)

## 2019-09-12 LAB — URINALYSIS, ROUTINE W REFLEX MICROSCOPIC
Bilirubin Urine: NEGATIVE
Glucose, UA: NEGATIVE mg/dL
Hgb urine dipstick: NEGATIVE
Ketones, ur: NEGATIVE mg/dL
Leukocytes,Ua: NEGATIVE
Nitrite: NEGATIVE
Protein, ur: NEGATIVE mg/dL
Specific Gravity, Urine: 1.009 (ref 1.005–1.030)
pH: 5 (ref 5.0–8.0)

## 2019-09-12 LAB — CBC WITH DIFFERENTIAL/PLATELET
Abs Immature Granulocytes: 0.07 10*3/uL (ref 0.00–0.07)
Basophils Absolute: 0 10*3/uL (ref 0.0–0.1)
Basophils Relative: 0 %
Eosinophils Absolute: 0.1 10*3/uL (ref 0.0–0.5)
Eosinophils Relative: 1 %
HCT: 21.9 % — ABNORMAL LOW (ref 36.0–46.0)
Hemoglobin: 5.2 g/dL — CL (ref 12.0–15.0)
Immature Granulocytes: 1 %
Lymphocytes Relative: 23 %
Lymphs Abs: 2.4 10*3/uL (ref 0.7–4.0)
MCH: 15.7 pg — ABNORMAL LOW (ref 26.0–34.0)
MCHC: 23.7 g/dL — ABNORMAL LOW (ref 30.0–36.0)
MCV: 66 fL — ABNORMAL LOW (ref 80.0–100.0)
Monocytes Absolute: 0.8 10*3/uL (ref 0.1–1.0)
Monocytes Relative: 7 %
Neutro Abs: 6.8 10*3/uL (ref 1.7–7.7)
Neutrophils Relative %: 68 %
Platelets: 408 10*3/uL — ABNORMAL HIGH (ref 150–400)
RBC: 3.32 MIL/uL — ABNORMAL LOW (ref 3.87–5.11)
RDW: 20.5 % — ABNORMAL HIGH (ref 11.5–15.5)
WBC: 10.1 10*3/uL (ref 4.0–10.5)
nRBC: 0.7 % — ABNORMAL HIGH (ref 0.0–0.2)

## 2019-09-12 LAB — HIV ANTIBODY (ROUTINE TESTING W REFLEX): HIV Screen 4th Generation wRfx: NONREACTIVE

## 2019-09-12 LAB — IRON AND TIBC
Iron: 17 ug/dL — ABNORMAL LOW (ref 28–170)
Saturation Ratios: 3 % — ABNORMAL LOW (ref 10.4–31.8)
TIBC: 552 ug/dL — ABNORMAL HIGH (ref 250–450)
UIBC: 535 ug/dL

## 2019-09-12 LAB — POC URINE PREG, ED: Preg Test, Ur: NEGATIVE

## 2019-09-12 LAB — PREPARE RBC (CROSSMATCH)

## 2019-09-12 LAB — ABO/RH: ABO/RH(D): A NEG

## 2019-09-12 LAB — FERRITIN: Ferritin: 2 ng/mL — ABNORMAL LOW (ref 11–307)

## 2019-09-12 LAB — LIPASE, BLOOD: Lipase: 35 U/L (ref 11–51)

## 2019-09-12 LAB — POC OCCULT BLOOD, ED: Fecal Occult Bld: POSITIVE — AB

## 2019-09-12 MED ORDER — SODIUM CHLORIDE 0.9 % IV SOLN: 10.0000 mL/h | Freq: Once | INTRAVENOUS | Status: DC

## 2019-09-12 MED ORDER — PANTOPRAZOLE SODIUM 40 MG IV SOLR
40.0000 mg | Freq: Two times a day (BID) | INTRAVENOUS | Status: DC
  Administered 2019-09-12: 23:00:00 40 mg via INTRAVENOUS
  Filled 2019-09-12: qty 40

## 2019-09-12 MED ORDER — SODIUM CHLORIDE 0.9 % IV SOLN
8.0000 mg/h | INTRAVENOUS | Status: DC
  Filled 2019-09-12: qty 80

## 2019-09-12 MED ORDER — ALBUTEROL SULFATE (2.5 MG/3ML) 0.083% IN NEBU: 2.5000 mg | INHALATION_SOLUTION | Freq: Four times a day (QID) | RESPIRATORY_TRACT | Status: DC | PRN

## 2019-09-12 MED ORDER — PANTOPRAZOLE SODIUM 40 MG IV SOLR
80.0000 mg | Freq: Once | INTRAVENOUS | Status: AC
  Administered 2019-09-12: 80 mg via INTRAVENOUS
  Filled 2019-09-12: qty 80

## 2019-09-12 MED ORDER — PROMETHAZINE HCL 25 MG/ML IJ SOLN
25.0000 mg | Freq: Once | INTRAMUSCULAR | Status: AC
  Administered 2019-09-12: 14:00:00 25 mg via INTRAVENOUS
  Filled 2019-09-12: qty 1

## 2019-09-12 MED ORDER — HYDROCODONE-ACETAMINOPHEN 5-325 MG PO TABS
1.0000 | ORAL_TABLET | ORAL | Status: DC | PRN
  Administered 2019-09-12 – 2019-09-13 (×2): 1 via ORAL
  Filled 2019-09-12 (×2): qty 1

## 2019-09-12 MED ORDER — ALBUTEROL SULFATE HFA 108 (90 BASE) MCG/ACT IN AERS: 1.0000 | INHALATION_SPRAY | Freq: Four times a day (QID) | RESPIRATORY_TRACT | Status: DC | PRN

## 2019-09-12 MED ORDER — IOHEXOL 300 MG/ML  SOLN
100.0000 mL | Freq: Once | INTRAMUSCULAR | Status: AC | PRN
  Administered 2019-09-12: 15:00:00 100 mL via INTRAVENOUS

## 2019-09-12 MED ORDER — MORPHINE SULFATE (PF) 4 MG/ML IV SOLN
4.0000 mg | Freq: Once | INTRAVENOUS | Status: AC
  Administered 2019-09-12: 14:00:00 4 mg via INTRAVENOUS
  Filled 2019-09-12: qty 1

## 2019-09-12 MED ORDER — POLYVINYL ALCOHOL 1.4 % OP SOLN
1.0000 [drp] | Freq: Three times a day (TID) | OPHTHALMIC | Status: DC | PRN
  Filled 2019-09-12: qty 15

## 2019-09-12 MED ORDER — ONDANSETRON HCL 4 MG PO TABS: 4.0000 mg | ORAL_TABLET | Freq: Four times a day (QID) | ORAL | Status: DC | PRN

## 2019-09-12 MED ORDER — PANTOPRAZOLE SODIUM 40 MG IV SOLR: 40.0000 mg | Freq: Two times a day (BID) | INTRAVENOUS | Status: DC

## 2019-09-12 MED ORDER — SODIUM CHLORIDE (PF) 0.9 % IJ SOLN
INTRAMUSCULAR | Status: AC
  Filled 2019-09-12: qty 50

## 2019-09-12 MED ORDER — SODIUM CHLORIDE 0.9% FLUSH: 3.0000 mL | Freq: Once | INTRAVENOUS | Status: DC

## 2019-09-12 MED ORDER — OXYCODONE-ACETAMINOPHEN 5-325 MG PO TABS
1.0000 | ORAL_TABLET | ORAL | Status: DC | PRN
  Administered 2019-09-13: 1 via ORAL
  Filled 2019-09-12: qty 1

## 2019-09-12 MED ORDER — ONDANSETRON 8 MG PO TBDP: 8.0000 mg | ORAL_TABLET | Freq: Three times a day (TID) | ORAL | Status: DC | PRN

## 2019-09-12 MED ORDER — ONDANSETRON HCL 4 MG/2ML IJ SOLN: 4.0000 mg | Freq: Four times a day (QID) | INTRAMUSCULAR | Status: DC | PRN

## 2019-09-12 MED ORDER — SODIUM CHLORIDE 0.9 % IV SOLN: INTRAVENOUS | Status: DC

## 2019-09-12 MED ORDER — LORATADINE 10 MG PO TABS
10.0000 mg | ORAL_TABLET | Freq: Every day | ORAL | Status: DC
  Administered 2019-09-12 – 2019-09-14 (×3): 10 mg via ORAL
  Filled 2019-09-12 (×3): qty 1

## 2019-09-12 MED ORDER — ACETAMINOPHEN 325 MG PO TABS: 650.0000 mg | ORAL_TABLET | Freq: Four times a day (QID) | ORAL | Status: DC | PRN

## 2019-09-12 MED ORDER — VITAMIN B-12 1000 MCG PO TABS
1000.0000 ug | ORAL_TABLET | Freq: Every day | ORAL | Status: DC
  Administered 2019-09-13 – 2019-09-14 (×2): 1000 ug via ORAL
  Filled 2019-09-12 (×2): qty 1

## 2019-09-12 MED ORDER — SODIUM CHLORIDE 0.9 % IV BOLUS
1000.0000 mL | Freq: Once | INTRAVENOUS | Status: AC
  Administered 2019-09-12: 14:00:00 1000 mL via INTRAVENOUS

## 2019-09-12 NOTE — ED Notes (Signed)
Date and time results received: 09/12/19 2:19 PM  (use smartphrase ".now" to insert current time)  Test: Hgb Critical Value: 5.2  Name of Provider Notified: Silverio Lay  Orders Received? Or Actions Taken?: Orders Received - See Orders for details

## 2019-09-12 NOTE — ED Provider Notes (Signed)
[facility] DEPT Provider Note   CSN: [id number] Arrival date & time: 09/12/19  1051     History Chief Complaint  Patient presents with  . Emesis  . Dizziness    Carmen Ramos is a 44 y.o. female history of hypertension, bleeding gastric ulcers, hypertension, hiatal hernia here presenting with abdominal pain, rectal bleeding.  Patient states that she was diagnosed with Covid in end of January.  Since then she has been having constant abdominal cramps and poor appetite.  She states that whenever she eats she feels nauseated and she vomits.  She states that for the last week or so, she noticed some dark stools that is darker than usual.  She occasionally sees some blood in her stool as well.  She also has worsening abdominal cramps as well.  She is concerned that she may have a bleeding gastric ulcer which she had been before.  She states that she has no GI doctor here and her GI doctor was back in [place].  She moved here about 2 years ago.  The history is provided by the patient.       Past Medical History:  Diagnosis Date  . Anemia   . Asthma   . Bleeding ulcer   . Blood transfusion without reported diagnosis    6  . Hiatal hernia   . Hypertension     There are no problems to display for this patient.   Past Surgical History:  Procedure Laterality Date  . ABDOMINAL HYSTERECTOMY    . CESAREAN SECTION    . TUBAL LIGATION       OB History   No obstetric history on file.     Family History  Problem Relation Age of Onset  . Cancer Mother   . Parkinson's disease Father   . Dementia Father     Social History   Tobacco Use  . Smoking status: Current Some Day Smoker    Types: Cigars  . Smokeless tobacco: Never Used  Substance Use Topics  . Alcohol use: No  . Drug use: No    Home Medications Prior to Admission medications   Medication Sig Start Date End Date Taking? Authorizing Provider  acetaminophen (TYLENOL) 325 MG tablet  Take 650 mg by mouth every 6 (six) hours as needed for moderate pain.    [provider]  carboxymethylcellulose (REFRESH PLUS) 0.5 % SOLN Place 1 drop into both eyes 3 (three) times daily as needed (dry eyes).    [provider]  cetirizine (ZYRTEC) 10 MG tablet Take 10 mg by mouth daily as needed for allergies.    [provider]  ciprofloxacin (CIPRO) 500 MG tablet Take 1 tablet (500 mg total) by mouth 2 (two) times daily. 07/03/17   Jola Schmidt, MD  Cyanocobalamin (VITAMIN B-12 PO) Take 1 tablet by mouth daily.    [provider]  ferrous sulfate 325 (65 FE) MG tablet Take 325 mg by mouth 3 (three) times daily with meals.    [provider]  lisinopril-hydrochlorothiazide (PRINZIDE,ZESTORETIC) 20-25 MG tablet Take 1 tablet by mouth daily.    [provider]  metroNIDAZOLE (FLAGYL) 500 MG tablet Take 1 tablet (500 mg total) by mouth 2 (two) times daily. 07/03/17   Jola Schmidt, MD  ondansetron (ZOFRAN ODT) 8 MG disintegrating tablet Take 1 tablet (8 mg total) by mouth every 8 (eight) hours as needed for nausea or vomiting. 07/03/17   Jola Schmidt, MD  oxyCODONE-acetaminophen (PERCOCET/ROXICET) 5-325 MG  tablet Take 1 tablet by mouth every 4 (four) hours as needed for severe pain. 07/03/17   Azalia Bilis, MD  pantoprazole (PROTONIX) 40 MG tablet Take 40 mg by mouth 2 (two) times daily.    [provider]  potassium chloride (K-DUR) 10 MEQ tablet Take 2 tablets (20 mEq total) by mouth daily. Patient not taking: Reported on 07/02/2017 04/07/16 04/14/16  Nira Conn, MD    Allergies    Gluten meal and Latex  Review of Systems   Review of Systems  Gastrointestinal: Positive for vomiting.  Neurological: Positive for dizziness.  All other systems reviewed and are negative.   Physical Exam Updated Vital Signs BP 132/63   Pulse 85   Resp 18   Ht 5\' 3"  (1.6 m)   Wt 104.3 kg   SpO2 100%   BMI 40.74 kg/m   Physical  Exam Vitals and nursing note reviewed.  Constitutional:      Appearance: Normal appearance.  HENT:     Head: Normocephalic.     Right Ear: Tympanic membrane normal.     Nose: Nose normal.     Mouth/Throat:     Mouth: Mucous membranes are dry.  Eyes:     Extraocular Movements: Extraocular movements intact.     Comments: Pale   Cardiovascular:     Rate and Rhythm: Normal rate and regular rhythm.     Pulses: Normal pulses.     Heart sounds: Normal heart sounds.  Pulmonary:     Effort: Pulmonary effort is normal.     Breath sounds: Normal breath sounds.  Abdominal:     General: Abdomen is flat.     Comments: + diffuse tenderness   Genitourinary:    Comments: Black stool, guiac positive  Musculoskeletal:        General: Normal range of motion.     Cervical back: Normal range of motion.  Skin:    General: Skin is warm.     Capillary Refill: Capillary refill takes less than 2 seconds.  Neurological:     General: No focal deficit present.     Mental Status: She is alert and oriented to person, place, and time.  Psychiatric:        Mood and Affect: Mood normal.        Behavior: Behavior normal.     ED Results / Procedures / Treatments   Labs (all labs ordered are listed, but only abnormal results are displayed) Labs Reviewed  CBC WITH DIFFERENTIAL/PLATELET - Abnormal; Notable for the following components:      Result Value   RBC 3.32 (*)    Hemoglobin 5.2 (*)    HCT 21.9 (*)    MCV 66.0 (*)    MCH 15.7 (*)    MCHC 23.7 (*)    RDW 20.5 (*)    Platelets 408 (*)    nRBC 0.7 (*)    All other components within normal limits  COMPREHENSIVE METABOLIC PANEL - Abnormal; Notable for the following components:   BUN 24 (*)    Total Protein 8.3 (*)    AST 14 (*)    All other components within normal limits  POC OCCULT BLOOD, ED - Abnormal; Notable for the following components:   Fecal Occult Bld POSITIVE (*)    All other components within normal limits  LIPASE, BLOOD   URINALYSIS, ROUTINE W REFLEX MICROSCOPIC  CBG MONITORING, ED  I-STAT BETA HCG BLOOD, ED ([facility], [facility], [facility] ONLY)  TYPE AND SCREEN  PREPARE RBC (CROSSMATCH)    EKG None  Radiology No results found.  Procedures Procedures (including critical care time)  Medications Ordered in ED Medications  sodium chloride flush (NS) 0.9 % injection 3 mL (has no administration in time range)  0.9 %  sodium chloride infusion (has no administration in time range)  sodium chloride 0.9 % bolus 1,000 mL (1,000 mLs Intravenous New Bag/Given 09/12/19 1354)  pantoprazole (PROTONIX) 80 mg in sodium chloride 0.9 % 100 mL IVPB (80 mg Intravenous New Bag/Given 09/12/19 1407)  promethazine (PHENERGAN) injection 25 mg (25 mg Intravenous Given 09/12/19 1401)  morphine 4 MG/ML injection 4 mg (4 mg Intravenous Given 09/12/19 1403)    ED Course  I have reviewed the triage vital signs and the nursing notes.  Pertinent labs & imaging results that were available during my care of the patient were reviewed by me and considered in my medical decision making (see chart for details).    MDM Rules/Calculators/A&P                      Carmen Ramos is a 44 y.o. female here with ab pain, rectal bleeding.  Patient states that she has history of bleeding ulcers in the past.  Patient appears to have melena on exam that is guaiac positive.  Concerned that she may have a bleeding ulcer again.  Will check CBC and type and screen and chemistry.  Will get CT abdomen pelvis given diffuse abdominal tenderness.  4:18 PM Hg 5.2. Talked to Devens from [facility]. She agreed with transfusion 2 U PRBC and protonix bolus and drip. CT showed hiatal hernia. Hospitalist to admit for upper GI bleed likely from gastric ulcers.    Final Clinical Impression(s) / ED Diagnoses Final diagnoses:  None    Rx / DC Orders ED Discharge Orders    None       Charlynne Pander, MD 09/12/19 [phone]

## 2019-09-12 NOTE — ED Triage Notes (Signed)
Pt presents with c/o vomiting on and off for approx 1.5 months. Pt reports this week, she started vomiting again on Wednesday and has had some tarry stools. Pt also c/o some lethargy and having a hard time standing up without feeling dizzy. Pt reports having a mild case of Covid in January and has not felt right since then.

## 2019-09-12 NOTE — H&P (Addendum)
History and Physical    DOA: 09/12/2019  PCP: System, Pcp Not In  Patient coming from: Home  Chief Complaint: Melena  HPI: Carmen Ramos is a 44 y.o. female with history h/o hypertension, hiatal hernia, asthma, peptic ulcer disease, esophagitis who is on oral PPI, chronic anemia with iron supplements by at baseline presents with complaints of melena and dizziness associated with left upper quadrant abdominal pain-6/10,non radiating, worsens with food intake.  Patient reports symptom onset with left upper quadrant abdominal pain about a week back progressing to discolored stool (has chronic dark stools with iron supplements) and occasionally vomiting suspect with bright red blood.  She decided to come to the hospital as she felt dizzy at work.  She reports compliance with PPI twice daily and iron supplements at home.  Her baseline hemoglobin is 7-8.  Her last endoscopy was in [place] few years back.  Denies any alcohol use or NSAID use. ED course: Afebrile, respiratory rate 18, blood pressure 112/45, pulse 77, O2 sat 100% on room air. Work showed WBC 10.1, hemoglobin 5.2 (last lab in 2018 in our records shows 11.2 hemoglobin but according to patient baseline 7-8 g/dl), hematocrit 90.3, platelets 408, sodium 136, potassium 3.8, chloride 103, bicarb 22, BUN 24, creatinine 0.88.  Fecal occult blood test positive.  CT abdomen pelvis showed diverticulosis,moderate paraesophageal hernia with findings suggestive of distal esophagitis and reflux.  Patient initiated on 2 units PRBC transfusion while in ED.  Seen by [facility] who plan on performing EGD in a.m.   Review of Systems: As per HPI otherwise 10 point review of systems negative.    Past Medical History:  Diagnosis Date  . Anemia   . Asthma   . Bleeding ulcer   . Blood transfusion without reported diagnosis    6  . Hiatal hernia   . Hypertension     Past Surgical History:  Procedure Laterality Date  . ABDOMINAL HYSTERECTOMY    . CESAREAN  SECTION    . TUBAL LIGATION      Social history:  reports that she has been smoking cigars. She has never used smokeless tobacco. She reports that she does not drink alcohol or use drugs.   Allergies  Allergen Reactions  . Gluten Meal     Arthritis, IBS  . Latex Rash    Family History  Problem Relation Age of Onset  . Cancer Mother   . Parkinson's disease Father   . Dementia Father       Prior to Admission medications   Medication Sig Start Date End Date Taking? Authorizing Provider  acetaminophen (TYLENOL) 325 MG tablet Take 650 mg by mouth every 6 (six) hours as needed for moderate pain.    [provider]  carboxymethylcellulose (REFRESH PLUS) 0.5 % SOLN Place 1 drop into both eyes 3 (three) times daily as needed (dry eyes).    [provider]  cetirizine (ZYRTEC) 10 MG tablet Take 10 mg by mouth daily as needed for allergies.    [provider]  ciprofloxacin (CIPRO) 500 MG tablet Take 1 tablet (500 mg total) by mouth 2 (two) times daily. 07/03/17   Azalia Bilis, MD  Cyanocobalamin (VITAMIN B-12 PO) Take 1 tablet by mouth daily.    [provider]  ferrous sulfate 325 (65 FE) MG tablet Take 325 mg by mouth 3 (three) times daily with meals.    [provider]  lisinopril-hydrochlorothiazide (PRINZIDE,ZESTORETIC) 20-25 MG tablet Take 1 tablet by mouth daily.  [provider]  metroNIDAZOLE (FLAGYL) 500 MG tablet Take 1 tablet (500 mg total) by mouth 2 (two) times daily. 07/03/17   Jola Schmidt, MD  ondansetron (ZOFRAN ODT) 8 MG disintegrating tablet Take 1 tablet (8 mg total) by mouth every 8 (eight) hours as needed for nausea or vomiting. 07/03/17   Jola Schmidt, MD  oxyCODONE-acetaminophen (PERCOCET/ROXICET) 5-325 MG tablet Take 1 tablet by mouth every 4 (four) hours as needed for severe pain. 07/03/17   Jola Schmidt, MD  pantoprazole (PROTONIX) 40 MG tablet Take 40 mg by mouth 2 (two) times daily.    [provider]   potassium chloride (K-DUR) 10 MEQ tablet Take 2 tablets (20 mEq total) by mouth daily. Patient not taking: Reported on 07/02/2017 04/07/16 04/14/16  Fatima Blank, MD    Physical Exam Vitals:   09/12/19 1600 09/12/19 1630 09/12/19 1700 09/12/19 1730  BP: 138/62 (!) 139/53 (!) 112/45 125/66  Pulse: 76 76 77 72  Resp: (!) 21 19 18 17   Temp:      TempSrc:      SpO2: 100% 99% 100% 100%  Weight:      Height:        Constitutional: NAD, calm, comfortable Eyes: PERRL, lids and conjunctivae normal ENMT: Mucous membranes are moist. Posterior pharynx clear of any exudate or lesions.Normal dentition.  Neck: normal, supple, no masses, no thyromegaly Respiratory: clear to auscultation bilaterally, no wheezing, no crackles. Normal respiratory effort. No accessory muscle use.  Cardiovascular: Regular rate and rhythm, no murmurs / rubs / gallops. No extremity edema. 2+ pedal pulses. No carotid bruits.  Abdomen: no tenderness, no masses palpated. No hepatosplenomegaly. Bowel sounds positive.  Musculoskeletal: no clubbing / cyanosis. No joint deformity upper and lower extremities. Good ROM, no contractures. Normal muscle tone.  Neurologic: CN 2-12 grossly intact. Sensation intact, DTR normal. Strength 5/5 in all 4.  Psychiatric: Normal judgment and insight. Alert and oriented x 3. Normal mood.  SKIN/catheters: no rashes, lesions, ulcers. No induration  Labs on Admission: I have personally reviewed following labs and imaging studies  CBC: Recent Labs  Lab 09/12/19 1337  WBC 10.1  NEUTROABS 6.8  HGB 5.2*  HCT 21.9*  MCV 66.0*  PLT 644*   Basic Metabolic Panel: Recent Labs  Lab 09/12/19 1337  NA 136  K 3.8  CL 103  CO2 22  GLUCOSE 91  BUN 24*  CREATININE 0.88  CALCIUM 9.2   GFR: Estimated Creatinine Clearance: 93.3 mL/min (by C-G formula based on SCr of 0.88 mg/dL). Recent Labs  Lab 09/12/19 1337  WBC 10.1   Liver Function Tests: Recent Labs  Lab 09/12/19 1337    AST 14*  ALT 17  ALKPHOS 58  BILITOT 0.3  PROT 8.3*  ALBUMIN 4.2   Recent Labs  Lab 09/12/19 1337  LIPASE 35   No results for input(s): AMMONIA in the last 168 hours. Coagulation Profile: No results for input(s): INR, PROTIME in the last 168 hours. Cardiac Enzymes: No results for input(s): CKTOTAL, CKMB, CKMBINDEX, TROPONINI in the last 168 hours. BNP (last 3 results) No results for input(s): PROBNP in the last 8760 hours. HbA1C: No results for input(s): HGBA1C in the last 72 hours. CBG: No results for input(s): GLUCAP in the last 168 hours. Lipid Profile: No results for input(s): CHOL, HDL, LDLCALC, TRIG, CHOLHDL, LDLDIRECT in the last 72 hours. Thyroid Function Tests: No results for input(s): TSH, T4TOTAL, FREET4, T3FREE, THYROIDAB in the last 72 hours. Anemia Panel: No results for  input(s): VITAMINB12, FOLATE, FERRITIN, TIBC, IRON, RETICCTPCT in the last 72 hours. Urine analysis:    Component Value Date/Time   COLORURINE STRAW (A) 09/12/2019 1336   APPEARANCEUR CLEAR 09/12/2019 1336   LABSPEC 1.009 09/12/2019 1336   PHURINE 5.0 09/12/2019 1336   GLUCOSEU NEGATIVE 09/12/2019 1336   HGBUR NEGATIVE 09/12/2019 1336   BILIRUBINUR NEGATIVE 09/12/2019 1336   KETONESUR NEGATIVE 09/12/2019 1336   PROTEINUR NEGATIVE 09/12/2019 1336   NITRITE NEGATIVE 09/12/2019 1336   LEUKOCYTESUR NEGATIVE 09/12/2019 1336    Radiological Exams on Admission: Personally reviewed  CT ABDOMEN PELVIS W CONTRAST  Result Date: 09/12/2019 CLINICAL DATA:  Abdominal distension, vomiting EXAM: CT ABDOMEN AND PELVIS WITH CONTRAST TECHNIQUE: Multidetector CT imaging of the abdomen and pelvis was performed using the standard protocol following bolus administration of intravenous contrast. CONTRAST:  OMNIPAQUE IOHEXOL 300 MG/ML  SOLN COMPARISON:  July 03, 2017 FINDINGS: Lower chest: The visualized heart size within normal limits. No pericardial fluid/thickening. There is a moderate paraesophageal  hernia seen. Within the distal esophagus there appears to be a small amount of refluxed material. There is question of mild thickening seen within the distal GE junction. The visualized portions of the lungs are clear. Hepatobiliary: The liver is normal in density without focal abnormality.The main portal vein is patent. No evidence of calcified gallstones, gallbladder wall thickening or biliary dilatation. Pancreas: Unremarkable. No pancreatic ductal dilatation or surrounding inflammatory changes. Spleen: Normal in size without focal abnormality. Adrenals/Urinary Tract: Both adrenal glands appear normal. The kidneys and collecting system appear normal without evidence of urinary tract calculus or hydronephrosis. Bladder is unremarkable. Stomach/Bowel: There is a moderate paraesophageal hernia as described above. The small bowel and colon are normal in size and contour. There is scattered colonic diverticula present. Vascular/Lymphatic: There are no enlarged mesenteric, retroperitoneal, or pelvic lymph nodes. No significant vascular findings are present. Reproductive: The uterus and adnexa are unremarkable. Other: A small fat containing anterior umbilical hernia is present. Musculoskeletal: No acute or significant osseous findings. IMPRESSION: Moderate paraesophageal hernia with findings suggestive of distal esophagitis and reflux. Diverticulosis without diverticulitis. Electronically Signed   By: Jonna Clark M.D.   On: 09/12/2019 15:36    EKG: Not done in ED.  Ordered now   Assessment and Plan:   Principal Problem:   Upper GI bleed Active Problems:   Acute blood loss anemia   Melena   Iron deficiency anemia due to chronic blood loss   HTN (hypertension)   Hiatal hernia   Asthma, chronic, unspecified asthma severity, with acute exacerbation    1.  Upper GI bleed: Secondary to recurrent esophagitis/gastritis versus bleeding ulcer. She believes sh tested negative for H pylori in the past. Admit  with IV PPI drip.  Supportive management with IV fluids/blood transfusion.  Seen by GI and plan for EGD in a.m.  N.p.o. after midnight.  Hemodynamically stable as of now.  Monitor with serial H&H.  2.  Acute on chronic microcytic anemia: Patient on chronic iron supplements.  Ordered for 2 units of PRBC.  Hold iron supplements for now given ongoing melena and plan for EGD.  Monitor posttransfusion hemoglobin in the setting of problem #1.  3.  Hypertension: Hold lisinopril/HCTZ in the setting of GI bleed.  Can resume if blood pressure elevated.  4.  Asthma: Stable.  Resume home medications  5.  Mild thrombocytosis: Appears chronic-likely reactionary to chronic anemia  6. Abdominal pain: likely related to #1. No other acute pathology on CT. Symptomatic mx.  DVT prophylaxis: SCDs given GI bleed.  COVID screen: Pending  Code Status: Full code.Health care proxy would be significant other-Mr. Will Bradbrook  Patient/Family Communication: Discussed with patient and all questions answered to satisfaction.  Consults called: [facility] Admission status :Patient will be admitted under OBSERVATION status.The patient's presenting symptoms, physical exam findings, and initial radiographic and laboratory data in the context of their medical condition is felt to place them at low risk for further clinical deterioration. Furthermore, it is anticipated that the patient will be medically stable for discharge from the hospital within 2 midnights of hospital stay.  Can likely go home in a.m. after EGD if hemoglobin stable and cleared by GI.     Alessandra Bevels MD [facility] Pager in [place]  If 7PM-7AM, please contact night-coverage [email or web address]   09/12/2019, 5:53 PM

## 2019-09-12 NOTE — ED Notes (Signed)
Pt in CT.

## 2019-09-12 NOTE — ED Notes (Signed)
Pt not displaying any signs of blood transfusion reactions at this time. Will continue to monitor. Attempted to start second IV without success. IV team consulted.

## 2019-09-12 NOTE — H&P (View-Only) (Signed)
 Referring Provider:  EDP, Dr. Yao Primary Care Physician:  System, Pcp Not In Primary Gastroenterologist:  None, unassigned  Reason for Consultation:  Anemia and heme positive stool  HPI: Carmen Ramos is a 44 y.o. female with history of longstanding iron deficiency anemia.  She says that she has been on iron supplements for years and has received 6 blood transfusions in the past.  None within the past year or 2.  She says that her hemoglobin tends to hang in the 7 g range.  If it has ever reached 10 g and that is really great for her.  She says that she received a couple of different kinds of IV iron infusions in the past, but had local reactions to those.  Tells me that she has a history of hiatal hernia and gastric ulcers.  Is on pantoprazole 40 mg twice daily at home.  Tells me that she had a colonoscopy in [place] about 3 or 4 years ago with no bleeding source identified.  No NSAID use.  She presented to [place] emergency department today with complaints of dizziness, black stools, and left upper quadrant abdominal pain.  She says that she has been having left upper quadrant abdominal pain for quite some time.  Says that she has intermittent episodes of vomiting and has seen small amounts of bright red blood in her emesis at times.  She says that her stools are always dark because of her iron supplements, but since Tuesday of this week they had been darker than usual and just different than what she is used to seeing.  She says that today at work she started having tunnel vision and became lightheaded and dizzy.  Came here to the emergency department is found to have a hemoglobin of 5.2 g.  She is going to receive 2 units of packed red blood cells.  She is CT abdomen pelvis with contrast.  This just showed moderate paraesophageal hernia with findings suggestive of distal esophagitis and reflux.  She has had a hysterectomy so no menstrual losses to account for iron deficiency/anemia.  BUN  elevated at 24.  MCV low at 66.   Past Medical History:  Diagnosis Date  . Anemia   . Asthma   . Bleeding ulcer   . Blood transfusion without reported diagnosis    6  . Hiatal hernia   . Hypertension     Past Surgical History:  Procedure Laterality Date  . ABDOMINAL HYSTERECTOMY    . CESAREAN SECTION    . TUBAL LIGATION      Prior to Admission medications   Medication Sig Start Date End Date Taking? Authorizing Provider  acetaminophen (TYLENOL) 325 MG tablet Take 650 mg by mouth every 6 (six) hours as needed for moderate pain.    [provider]  carboxymethylcellulose (REFRESH PLUS) 0.5 % SOLN Place 1 drop into both eyes 3 (three) times daily as needed (dry eyes).    [provider]  cetirizine (ZYRTEC) 10 MG tablet Take 10 mg by mouth daily as needed for allergies.    [provider]  ciprofloxacin (CIPRO) 500 MG tablet Take 1 tablet (500 mg total) by mouth 2 (two) times daily. 07/03/17   Campos, Kevin, MD  Cyanocobalamin (VITAMIN B-12 PO) Take 1 tablet by mouth daily.    [provider]  ferrous sulfate 325 (65 FE) MG tablet Take 325 mg by mouth 3 (three) times daily with meals.    [provider]  lisinopril-hydrochlorothiazide (  PRINZIDE,ZESTORETIC) 20-25 MG tablet Take 1 tablet by mouth daily.    [provider]  metroNIDAZOLE (FLAGYL) 500 MG tablet Take 1 tablet (500 mg total) by mouth 2 (two) times daily. 07/03/17   Jola Schmidt, MD  ondansetron (ZOFRAN ODT) 8 MG disintegrating tablet Take 1 tablet (8 mg total) by mouth every 8 (eight) hours as needed for nausea or vomiting. 07/03/17   Jola Schmidt, MD  oxyCODONE-acetaminophen (PERCOCET/ROXICET) 5-325 MG tablet Take 1 tablet by mouth every 4 (four) hours as needed for severe pain. 07/03/17   Jola Schmidt, MD  pantoprazole (PROTONIX) 40 MG tablet Take 40 mg by mouth 2 (two) times daily.    [provider]  potassium chloride (K-DUR) 10 MEQ tablet Take 2 tablets (20 mEq  total) by mouth daily. Patient not taking: Reported on 07/02/2017 04/07/16 04/14/16  Fatima Blank, MD    Current Facility-Administered Medications  Medication Dose Route Frequency Provider Last Rate Last Admin  . 0.9 %  sodium chloride infusion  10 mL/hr Intravenous Once Drenda Freeze, MD      . pantoprazole (PROTONIX) 80 mg in sodium chloride 0.9 % 100 mL (0.8 mg/mL) infusion  8 mg/hr Intravenous Continuous Drenda Freeze, MD      . sodium chloride flush (NS) 0.9 % injection 3 mL  3 mL Intravenous Once Drenda Freeze, MD       Current Outpatient Medications  Medication Sig Dispense Refill  . acetaminophen (TYLENOL) 325 MG tablet Take 650 mg by mouth every 6 (six) hours as needed for moderate pain.    . carboxymethylcellulose (REFRESH PLUS) 0.5 % SOLN Place 1 drop into both eyes 3 (three) times daily as needed (dry eyes).    . cetirizine (ZYRTEC) 10 MG tablet Take 10 mg by mouth daily as needed for allergies.    . ciprofloxacin (CIPRO) 500 MG tablet Take 1 tablet (500 mg total) by mouth 2 (two) times daily. 14 tablet 0  . Cyanocobalamin (VITAMIN B-12 PO) Take 1 tablet by mouth daily.    . ferrous sulfate 325 (65 FE) MG tablet Take 325 mg by mouth 3 (three) times daily with meals.    Marland Kitchen lisinopril-hydrochlorothiazide (PRINZIDE,ZESTORETIC) 20-25 MG tablet Take 1 tablet by mouth daily.    . metroNIDAZOLE (FLAGYL) 500 MG tablet Take 1 tablet (500 mg total) by mouth 2 (two) times daily. 14 tablet 0  . ondansetron (ZOFRAN ODT) 8 MG disintegrating tablet Take 1 tablet (8 mg total) by mouth every 8 (eight) hours as needed for nausea or vomiting. 10 tablet 0  . oxyCODONE-acetaminophen (PERCOCET/ROXICET) 5-325 MG tablet Take 1 tablet by mouth every 4 (four) hours as needed for severe pain. 10 tablet 0  . pantoprazole (PROTONIX) 40 MG tablet Take 40 mg by mouth 2 (two) times daily.    . potassium chloride (K-DUR) 10 MEQ tablet Take 2 tablets (20 mEq total) by mouth daily. (Patient  not taking: Reported on 07/02/2017) 14 tablet 0    Allergies as of 09/12/2019 - Review Complete 09/12/2019  Allergen Reaction Noted  . Gluten meal  04/07/2016  . Latex Rash 04/07/2016    Family History  Problem Relation Age of Onset  . Cancer Mother   . Parkinson's disease Father   . Dementia Father     Social History   Socioeconomic History  . Marital status: Married    Spouse name: Not on file  . Number of children: Not on file  . Years of education: Not on file  .  Highest education level: Not on file  Occupational History  . Not on file  Tobacco Use  . Smoking status: Current Some Day Smoker    Types: Cigars  . Smokeless tobacco: Never Used  Substance and Sexual Activity  . Alcohol use: No  . Drug use: No  . Sexual activity: Never  Other Topics Concern  . Not on file  Social History Narrative  . Not on file   Social Determinants of Health   Financial Resource Strain:   . Difficulty of Paying Living Expenses:   Food Insecurity:   . Worried About Programme researcher, broadcasting/film/video in the Last Year:   . Barista in the Last Year:   Transportation Needs:   . Freight forwarder (Medical):   Marland Kitchen Lack of Transportation (Non-Medical):   Physical Activity:   . Days of Exercise per Week:   . Minutes of Exercise per Session:   Stress:   . Feeling of Stress :   Social Connections:   . Frequency of Communication with Friends and Family:   . Frequency of Social Gatherings with Friends and Family:   . Attends Religious Services:   . Active Member of Clubs or Organizations:   . Attends Banker Meetings:   Marland Kitchen Marital Status:   Intimate Partner Violence:   . Fear of Current or Ex-Partner:   . Emotionally Abused:   Marland Kitchen Physically Abused:   . Sexually Abused:     Review of Systems: ROS is O/W negative except as mentioned in HPI.  Physical Exam: Vital signs in last 24 hours: Pulse Rate:  [73-85] 85 (03/12 1408) Resp:  [15-19] 18 (03/12 1408) BP:  (126-132)/(53-66) 132/63 (03/12 1408) SpO2:  [99 %-100 %] 100 % (03/12 1408) Weight:  [104.3 kg] 104.3 kg (03/12 1107)   General:  Alert, Well-developed, well-nourished, pleasant and cooperative in NAD Head:  Normocephalic and atraumatic. Eyes:  Sclera clear, no icterus.  Conjunctiva pink. Ears:  Normal auditory acuity. Mouth:  No deformity or lesions.   Lungs:  Clear throughout to auscultation.  No wheezes, crackles, or rhonchi.  Heart:  Regular rate and rhythm; no murmurs, clicks, rubs, or gallops. Abdomen:  Soft, non-distended.  BS present.  Mild diffuse TTP.  Rectal:  Deferred.  Heme positive in the ED.  Msk:  Symmetrical without gross deformities. Pulses:  Normal pulses noted. Extremities:  Without clubbing or edema. Neurologic:  Alert and oriented x 4;  grossly normal neurologically. Skin:  Intact without significant lesions or rashes. Psych:  Alert and cooperative. Normal mood and affect.  Lab Results: Recent Labs    09/12/19 1337  WBC 10.1  HGB 5.2*  HCT 21.9*  PLT 408*   BMET Recent Labs    09/12/19 1337  NA 136  K 3.8  CL 103  CO2 22  GLUCOSE 91  BUN 24*  CREATININE 0.88  CALCIUM 9.2   LFT Recent Labs    09/12/19 1337  PROT 8.3*  ALBUMIN 4.2  AST 14*  ALT 17  ALKPHOS 58  BILITOT 0.3   IMPRESSION:  *Acute on chronic microcytic anemia: Says that she has been anemic and iron deficient for years.  Says that her hemoglobin tends to hang in about 7 g range.  If she ever reaches 10 g and that is really great for her.  Says she is on ongoing iron supplement by mouth.  Apparently had reactions to a few different IV iron in the past.  Hemoglobin 5.2 g  here.  Going to receive 2 units packed red blood cells. *Heme positive stools: Says that stools are always dark because of her iron, but had changed and were a different more black color recently for the last several days. *History of hiatal hernia and peptic ulcer disease/gastric ulcers by her report.  Is on  pantoprazole 40 mg twice daily at home.  CT scan today shows moderate paraesophageal hernia with findings suggestive of distal esophagitis and reflux.  Rule out Cameron's ulcers.  BUN slightly elevated.  PLAN: -Will plan for EGD tomorrow morning.  Can have clear liquids this evening and then n.p.o. after midnight. -Pantoprazole 40 mg IV twice daily. -Check iron studies.  Was going to consider IV iron infusion, but says that she has had reactions to a couple of different IV iron medications in the past. -Monitor hemoglobin and transfuse further if needed.   Princella Pellegrini. Zehr  09/12/2019, 3:06 PM   Attending physician's note   I have taken a history, examined the patient and reviewed the chart. I agree with the Advanced Practitioner's note, impression and recommendations.  44 year old female with chronic iron deficiency anemia s/p hysterectomy mated with severe microcytic anemia  Patient had colonoscopy 3 to 4 years ago in [place], was normal and she also had endoscopy records not available.  She recently moved here from [place]  We will plan to proceed with EGD to exclude upper GI blood loss given history of intermittent black stool.  If no source of potential GI blood loss on EGD, will consider colonoscopy and small bowel video capsule for further evaluation if needed, can be performed as outpatient if she remains hemodynamically stable and hemoglobin improved s/p transfusion  Clear liquids N.p.o. after midnight PPI twice daily  The risks and benefits as well as alternatives of endoscopic procedure(s) have been discussed and reviewed. All questions answered. The patient agrees to proceed.   Iona Beard , MD [phone]

## 2019-09-12 NOTE — Consult Note (Addendum)
Referring Provider:  EDP, Dr. Silverio Lay Primary Care Physician:  System, Pcp Not In Primary Gastroenterologist:  None, unassigned  Reason for Consultation:  Anemia and heme positive stool  HPI: Carmen Ramos is a 44 y.o. female with history of longstanding iron deficiency anemia.  She says that she has been on iron supplements for years and has received 6 blood transfusions in the past.  None within the past year or 2.  She says that her hemoglobin tends to hang in the 7 g range.  If it has ever reached 10 g and that is really great for her.  She says that she received a couple of different kinds of IV iron infusions in the past, but had local reactions to those.  Tells me that she has a history of hiatal hernia and gastric ulcers.  Is on pantoprazole 40 mg twice daily at home.  Tells me that she had a colonoscopy in [place] about 3 or 4 years ago with no bleeding source identified.  No NSAID use.  She presented to [facility] long emergency department today with complaints of dizziness, black stools, and left upper quadrant abdominal pain.  She says that she has been having left upper quadrant abdominal pain for quite some time.  Says that she has intermittent episodes of vomiting and has seen small amounts of bright red blood in her emesis at times.  She says that her stools are always dark because of her iron supplements, but since Tuesday of this week they had been darker than usual and just different than what she is used to seeing.  She says that today at work she started having tunnel vision and became lightheaded and dizzy.  Came here to the emergency department is found to have a hemoglobin of 5.2 g.  She is going to receive 2 units of packed red blood cells.  She is CT abdomen pelvis with contrast.  This just showed moderate paraesophageal hernia with findings suggestive of distal esophagitis and reflux.  She has had a hysterectomy so no menstrual losses to account for iron deficiency/anemia.  BUN  elevated at 24.  MCV low at 66.   Past Medical History:  Diagnosis Date  . Anemia   . Asthma   . Bleeding ulcer   . Blood transfusion without reported diagnosis    4  . Hiatal hernia   . Hypertension     Past Surgical History:  Procedure Laterality Date  . ABDOMINAL HYSTERECTOMY    . CESAREAN SECTION    . TUBAL LIGATION      Prior to Admission medications   Medication Sig Start Date End Date Taking? Authorizing Provider  acetaminophen (TYLENOL) 325 MG tablet Take 650 mg by mouth every 6 (six) hours as needed for moderate pain.    [provider]  carboxymethylcellulose (REFRESH PLUS) 0.5 % SOLN Place 1 drop into both eyes 3 (three) times daily as needed (dry eyes).    [provider]  cetirizine (ZYRTEC) 10 MG tablet Take 10 mg by mouth daily as needed for allergies.    [provider]  ciprofloxacin (CIPRO) 500 MG tablet Take 1 tablet (500 mg total) by mouth 2 (two) times daily. 07/03/17   Azalia Bilis, MD  Cyanocobalamin (VITAMIN B-12 PO) Take 1 tablet by mouth daily.    [provider]  ferrous sulfate 325 (65 FE) MG tablet Take 325 mg by mouth 3 (three) times daily with meals.    [provider]  lisinopril-hydrochlorothiazide (  PRINZIDE,ZESTORETIC) 20-25 MG tablet Take 1 tablet by mouth daily.    [provider]  metroNIDAZOLE (FLAGYL) 500 MG tablet Take 1 tablet (500 mg total) by mouth 2 (two) times daily. 07/03/17   Jola Schmidt, MD  ondansetron (ZOFRAN ODT) 8 MG disintegrating tablet Take 1 tablet (8 mg total) by mouth every 8 (eight) hours as needed for nausea or vomiting. 07/03/17   Jola Schmidt, MD  oxyCODONE-acetaminophen (PERCOCET/ROXICET) 5-325 MG tablet Take 1 tablet by mouth every 4 (four) hours as needed for severe pain. 07/03/17   Jola Schmidt, MD  pantoprazole (PROTONIX) 40 MG tablet Take 40 mg by mouth 2 (two) times daily.    [provider]  potassium chloride (K-DUR) 10 MEQ tablet Take 2 tablets (20 mEq  total) by mouth daily. Patient not taking: Reported on 07/02/2017 04/07/16 04/14/16  Fatima Blank, MD    Current Facility-Administered Medications  Medication Dose Route Frequency Provider Last Rate Last Admin  . 0.9 %  sodium chloride infusion  10 mL/hr Intravenous Once Drenda Freeze, MD      . pantoprazole (PROTONIX) 80 mg in sodium chloride 0.9 % 100 mL (0.8 mg/mL) infusion  8 mg/hr Intravenous Continuous Drenda Freeze, MD      . sodium chloride flush (NS) 0.9 % injection 3 mL  3 mL Intravenous Once Drenda Freeze, MD       Current Outpatient Medications  Medication Sig Dispense Refill  . acetaminophen (TYLENOL) 325 MG tablet Take 650 mg by mouth every 6 (six) hours as needed for moderate pain.    . carboxymethylcellulose (REFRESH PLUS) 0.5 % SOLN Place 1 drop into both eyes 3 (three) times daily as needed (dry eyes).    . cetirizine (ZYRTEC) 10 MG tablet Take 10 mg by mouth daily as needed for allergies.    . ciprofloxacin (CIPRO) 500 MG tablet Take 1 tablet (500 mg total) by mouth 2 (two) times daily. 14 tablet 0  . Cyanocobalamin (VITAMIN B-12 PO) Take 1 tablet by mouth daily.    . ferrous sulfate 325 (65 FE) MG tablet Take 325 mg by mouth 3 (three) times daily with meals.    Marland Kitchen lisinopril-hydrochlorothiazide (PRINZIDE,ZESTORETIC) 20-25 MG tablet Take 1 tablet by mouth daily.    . metroNIDAZOLE (FLAGYL) 500 MG tablet Take 1 tablet (500 mg total) by mouth 2 (two) times daily. 14 tablet 0  . ondansetron (ZOFRAN ODT) 8 MG disintegrating tablet Take 1 tablet (8 mg total) by mouth every 8 (eight) hours as needed for nausea or vomiting. 10 tablet 0  . oxyCODONE-acetaminophen (PERCOCET/ROXICET) 5-325 MG tablet Take 1 tablet by mouth every 4 (four) hours as needed for severe pain. 10 tablet 0  . pantoprazole (PROTONIX) 40 MG tablet Take 40 mg by mouth 2 (two) times daily.    . potassium chloride (K-DUR) 10 MEQ tablet Take 2 tablets (20 mEq total) by mouth daily. (Patient  not taking: Reported on 07/02/2017) 14 tablet 0    Allergies as of 09/12/2019 - Review Complete 09/12/2019  Allergen Reaction Noted  . Gluten meal  04/07/2016  . Latex Rash 04/07/2016    Family History  Problem Relation Age of Onset  . Cancer Mother   . Parkinson's disease Father   . Dementia Father     Social History   Socioeconomic History  . Marital status: Married    Spouse name: Not on file  . Number of children: Not on file  . Years of education: Not on file  .  Highest education level: Not on file  Occupational History  . Not on file  Tobacco Use  . Smoking status: Current Some Day Smoker    Types: Cigars  . Smokeless tobacco: Never Used  Substance and Sexual Activity  . Alcohol use: No  . Drug use: No  . Sexual activity: Never  Other Topics Concern  . Not on file  Social History Narrative  . Not on file   Social Determinants of Health   Financial Resource Strain:   . Difficulty of Paying Living Expenses:   Food Insecurity:   . Worried About Programme researcher, broadcasting/film/video in the Last Year:   . Barista in the Last Year:   Transportation Needs:   . Freight forwarder (Medical):   Marland Kitchen Lack of Transportation (Non-Medical):   Physical Activity:   . Days of Exercise per Week:   . Minutes of Exercise per Session:   Stress:   . Feeling of Stress :   Social Connections:   . Frequency of Communication with Friends and Family:   . Frequency of Social Gatherings with Friends and Family:   . Attends Religious Services:   . Active Member of Clubs or Organizations:   . Attends Banker Meetings:   Marland Kitchen Marital Status:   Intimate Partner Violence:   . Fear of Current or Ex-Partner:   . Emotionally Abused:   Marland Kitchen Physically Abused:   . Sexually Abused:     Review of Systems: ROS is O/W negative except as mentioned in HPI.  Physical Exam: Vital signs in last 24 hours: Pulse Rate:  [73-85] 85 (03/12 1408) Resp:  [15-19] 18 (03/12 1408) BP:  (126-132)/(53-66) 132/63 (03/12 1408) SpO2:  [99 %-100 %] 100 % (03/12 1408) Weight:  [104.3 kg] 104.3 kg (03/12 1107)   General:  Alert, Well-developed, well-nourished, pleasant and cooperative in NAD Head:  Normocephalic and atraumatic. Eyes:  Sclera clear, no icterus.  Conjunctiva pink. Ears:  Normal auditory acuity. Mouth:  No deformity or lesions.   Lungs:  Clear throughout to auscultation.  No wheezes, crackles, or rhonchi.  Heart:  Regular rate and rhythm; no murmurs, clicks, rubs, or gallops. Abdomen:  Soft, non-distended.  BS present.  Mild diffuse TTP.  Rectal:  Deferred.  Heme positive in the ED.  Msk:  Symmetrical without gross deformities. Pulses:  Normal pulses noted. Extremities:  Without clubbing or edema. Neurologic:  Alert and oriented x 4;  grossly normal neurologically. Skin:  Intact without significant lesions or rashes. Psych:  Alert and cooperative. Normal mood and affect.  Lab Results: Recent Labs    09/12/19 1337  WBC 10.1  HGB 5.2*  HCT 21.9*  PLT 408*   BMET Recent Labs    09/12/19 1337  NA 136  K 3.8  CL 103  CO2 22  GLUCOSE 91  BUN 24*  CREATININE 0.88  CALCIUM 9.2   LFT Recent Labs    09/12/19 1337  PROT 8.3*  ALBUMIN 4.2  AST 14*  ALT 17  ALKPHOS 58  BILITOT 0.3   IMPRESSION:  *Acute on chronic microcytic anemia: Says that she has been anemic and iron deficient for years.  Says that her hemoglobin tends to hang in about 7 g range.  If she ever reaches 10 g and that is really great for her.  Says she is on ongoing iron supplement by mouth.  Apparently had reactions to a few different IV iron in the past.  Hemoglobin 5.2 g  here.  Going to receive 2 units packed red blood cells. *Heme positive stools: Says that stools are always dark because of her iron, but had changed and were a different more black color recently for the last several days. *History of hiatal hernia and peptic ulcer disease/gastric ulcers by her report.  Is on  pantoprazole 40 mg twice daily at home.  CT scan today shows moderate paraesophageal hernia with findings suggestive of distal esophagitis and reflux.  Rule out Cameron's ulcers.  BUN slightly elevated.  PLAN: -Will plan for EGD tomorrow morning.  Can have clear liquids this evening and then n.p.o. after midnight. -Pantoprazole 40 mg IV twice daily. -Check iron studies.  Was going to consider IV iron infusion, but says that she has had reactions to a couple of different IV iron medications in the past. -Monitor hemoglobin and transfuse further if needed.   Princella Pellegrini. Zehr  09/12/2019, 3:06 PM   Attending physician's note   I have taken a history, examined the patient and reviewed the chart. I agree with the Advanced Practitioner's note, impression and recommendations.  44 year old female with chronic iron deficiency anemia s/p hysterectomy mated with severe microcytic anemia  Patient had colonoscopy 3 to 4 years ago in [place], was normal and she also had endoscopy records not available.  She recently moved here from [place]  We will plan to proceed with EGD to exclude upper GI blood loss given history of intermittent black stool.  If no source of potential GI blood loss on EGD, will consider colonoscopy and small bowel video capsule for further evaluation if needed, can be performed as outpatient if she remains hemodynamically stable and hemoglobin improved s/p transfusion  Clear liquids N.p.o. after midnight PPI twice daily  The risks and benefits as well as alternatives of endoscopic procedure(s) have been discussed and reviewed. All questions answered. The patient agrees to proceed.   Iona Beard , MD [phone]

## 2019-09-12 NOTE — ED Notes (Signed)
Blood bank has blood ready for this patient,informed Shelia,RN.

## 2019-09-13 ENCOUNTER — Observation Stay (HOSPITAL_COMMUNITY): Payer: BC Managed Care – PPO | Admitting: Certified Registered Nurse Anesthetist

## 2019-09-13 ENCOUNTER — Encounter (HOSPITAL_COMMUNITY): Payer: Self-pay | Admitting: Internal Medicine

## 2019-09-13 ENCOUNTER — Encounter (HOSPITAL_COMMUNITY)
Admission: EM | Disposition: A | Discharge status: Home / Self Care | Payer: Self-pay | Source: Home / Self Care | Attending: Internal Medicine

## 2019-09-13 DIAGNOSIS — Z91018 Allergy to other foods: Secondary | ICD-10-CM | POA: Diagnosis not present

## 2019-09-13 DIAGNOSIS — Z8616 Personal history of COVID-19: Secondary | ICD-10-CM | POA: Diagnosis not present

## 2019-09-13 DIAGNOSIS — K2210 Ulcer of esophagus without bleeding: Principal | ICD-10-CM

## 2019-09-13 DIAGNOSIS — D62 Acute posthemorrhagic anemia: Secondary | ICD-10-CM | POA: Diagnosis present

## 2019-09-13 DIAGNOSIS — K922 Gastrointestinal hemorrhage, unspecified: Secondary | ICD-10-CM | POA: Diagnosis present

## 2019-09-13 DIAGNOSIS — F17290 Nicotine dependence, other tobacco product, uncomplicated: Secondary | ICD-10-CM | POA: Diagnosis present

## 2019-09-13 DIAGNOSIS — K921 Melena: Secondary | ICD-10-CM | POA: Diagnosis present

## 2019-09-13 DIAGNOSIS — R42 Dizziness and giddiness: Secondary | ICD-10-CM | POA: Diagnosis present

## 2019-09-13 DIAGNOSIS — Z91040 Latex allergy status: Secondary | ICD-10-CM | POA: Diagnosis not present

## 2019-09-13 DIAGNOSIS — J45901 Unspecified asthma with (acute) exacerbation: Secondary | ICD-10-CM | POA: Diagnosis not present

## 2019-09-13 DIAGNOSIS — Z888 Allergy status to other drugs, medicaments and biological substances status: Secondary | ICD-10-CM | POA: Diagnosis not present

## 2019-09-13 DIAGNOSIS — Z8711 Personal history of peptic ulcer disease: Secondary | ICD-10-CM | POA: Diagnosis not present

## 2019-09-13 DIAGNOSIS — Z820 Family history of epilepsy and other diseases of the nervous system: Secondary | ICD-10-CM | POA: Diagnosis not present

## 2019-09-13 DIAGNOSIS — K449 Diaphragmatic hernia without obstruction or gangrene: Secondary | ICD-10-CM | POA: Diagnosis present

## 2019-09-13 DIAGNOSIS — Z6841 Body Mass Index (BMI) 40.0 and over, adult: Secondary | ICD-10-CM | POA: Diagnosis not present

## 2019-09-13 DIAGNOSIS — Z20822 Contact with and (suspected) exposure to covid-19: Secondary | ICD-10-CM | POA: Diagnosis present

## 2019-09-13 DIAGNOSIS — Z79899 Other long term (current) drug therapy: Secondary | ICD-10-CM | POA: Diagnosis not present

## 2019-09-13 DIAGNOSIS — I10 Essential (primary) hypertension: Secondary | ICD-10-CM | POA: Diagnosis present

## 2019-09-13 DIAGNOSIS — J45909 Unspecified asthma, uncomplicated: Secondary | ICD-10-CM | POA: Diagnosis present

## 2019-09-13 DIAGNOSIS — E669 Obesity, unspecified: Secondary | ICD-10-CM | POA: Diagnosis present

## 2019-09-13 DIAGNOSIS — R7989 Other specified abnormal findings of blood chemistry: Secondary | ICD-10-CM | POA: Diagnosis present

## 2019-09-13 HISTORY — PX: ESOPHAGOGASTRODUODENOSCOPY (EGD) WITH PROPOFOL: SHX5813

## 2019-09-13 HISTORY — PX: BIOPSY: SHX5522

## 2019-09-13 LAB — SURGICAL PCR SCREEN
MRSA, PCR: NEGATIVE
Staphylococcus aureus: POSITIVE — AB

## 2019-09-13 LAB — HEMOGLOBIN AND HEMATOCRIT, BLOOD
HCT: 27.6 % — ABNORMAL LOW (ref 36.0–46.0)
HCT: 30.9 % — ABNORMAL LOW (ref 36.0–46.0)
Hemoglobin: 7 g/dL — ABNORMAL LOW (ref 12.0–15.0)
Hemoglobin: 8.4 g/dL — ABNORMAL LOW (ref 12.0–15.0)

## 2019-09-13 LAB — SARS CORONAVIRUS 2 (TAT 6-24 HRS): SARS Coronavirus 2: NEGATIVE

## 2019-09-13 LAB — BASIC METABOLIC PANEL
Anion gap: 12 (ref 5–15)
BUN: 15 mg/dL (ref 6–20)
CO2: 21 mmol/L — ABNORMAL LOW (ref 22–32)
Calcium: 8.6 mg/dL — ABNORMAL LOW (ref 8.9–10.3)
Chloride: 105 mmol/L (ref 98–111)
Creatinine, Ser: 0.73 mg/dL (ref 0.44–1.00)
GFR calc Af Amer: >60 mL/min (ref >60)
GFR calc non Af Amer: >60 mL/min (ref >60)
Glucose, Bld: 87 mg/dL (ref 70–99)
Potassium: 3.9 mmol/L (ref 3.5–5.1)
Sodium: 138 mmol/L (ref 135–145)

## 2019-09-13 LAB — PREPARE RBC (CROSSMATCH)

## 2019-09-13 SURGERY — ESOPHAGOGASTRODUODENOSCOPY (EGD) WITH PROPOFOL
Anesthesia: Monitor Anesthesia Care

## 2019-09-13 MED ORDER — CITALOPRAM HYDROBROMIDE 20 MG PO TABS
30.0000 mg | ORAL_TABLET | Freq: Every day | ORAL | Status: DC
  Administered 2019-09-13 – 2019-09-14 (×2): 30 mg via ORAL
  Filled 2019-09-13 (×2): qty 2

## 2019-09-13 MED ORDER — SODIUM CHLORIDE 0.9 % IV SOLN: INTRAVENOUS | Status: DC

## 2019-09-13 MED ORDER — PANTOPRAZOLE SODIUM 40 MG PO TBEC
40.0000 mg | DELAYED_RELEASE_TABLET | Freq: Two times a day (BID) | ORAL | Status: DC
  Administered 2019-09-13 – 2019-09-14 (×2): 40 mg via ORAL
  Filled 2019-09-13 (×3): qty 1

## 2019-09-13 MED ORDER — PROPOFOL 10 MG/ML IV BOLUS
INTRAVENOUS | Status: DC | PRN
  Administered 2019-09-13: 100 ug/kg/min via INTRAVENOUS

## 2019-09-13 MED ORDER — SODIUM CHLORIDE 0.9% IV SOLUTION: Freq: Once | INTRAVENOUS | Status: DC

## 2019-09-13 MED ORDER — LISINOPRIL 20 MG PO TABS
20.0000 mg | ORAL_TABLET | Freq: Every day | ORAL | Status: DC
  Administered 2019-09-13 – 2019-09-14 (×2): 20 mg via ORAL
  Filled 2019-09-13 (×2): qty 1

## 2019-09-13 MED ORDER — PHENYLEPHRINE HCL (PRESSORS) 10 MG/ML IV SOLN
INTRAVENOUS | Status: DC | PRN
  Administered 2019-09-13: 80 ug via INTRAVENOUS

## 2019-09-13 MED ORDER — HYDROXYZINE HCL 25 MG PO TABS
25.0000 mg | ORAL_TABLET | Freq: Once | ORAL | Status: AC
  Administered 2019-09-13: 25 mg via ORAL
  Filled 2019-09-13: qty 1

## 2019-09-13 MED ORDER — LACTATED RINGERS IV SOLN: INTRAVENOUS | Status: DC

## 2019-09-13 MED ORDER — HYDROCHLOROTHIAZIDE 25 MG PO TABS
25.0000 mg | ORAL_TABLET | Freq: Every day | ORAL | Status: DC
  Administered 2019-09-13 – 2019-09-14 (×2): 25 mg via ORAL
  Filled 2019-09-13 (×2): qty 1

## 2019-09-13 MED ORDER — SUCRALFATE 1 GM/10ML PO SUSP
1.0000 g | Freq: Two times a day (BID) | ORAL | Status: DC
  Administered 2019-09-13 – 2019-09-14 (×3): 1 g via ORAL
  Filled 2019-09-13 (×3): qty 10

## 2019-09-13 MED ORDER — METOPROLOL SUCCINATE ER 25 MG PO TB24
25.0000 mg | ORAL_TABLET | Freq: Every day | ORAL | Status: DC
  Administered 2019-09-13 – 2019-09-14 (×2): 25 mg via ORAL
  Filled 2019-09-13 (×2): qty 1

## 2019-09-13 MED ORDER — LISINOPRIL-HYDROCHLOROTHIAZIDE 20-25 MG PO TABS: 1.0000 | ORAL_TABLET | Freq: Every day | ORAL | Status: DC

## 2019-09-13 MED ORDER — SODIUM CHLORIDE 0.9 % IV SOLN
510.0000 mg | Freq: Once | INTRAVENOUS | Status: AC
  Administered 2019-09-13: 12:00:00 510 mg via INTRAVENOUS
  Filled 2019-09-13: qty 510

## 2019-09-13 MED ORDER — METHYLPREDNISOLONE SODIUM SUCC 125 MG IJ SOLR
60.0000 mg | Freq: Once | INTRAMUSCULAR | Status: AC
  Administered 2019-09-13: 60 mg via INTRAVENOUS
  Filled 2019-09-13: qty 2

## 2019-09-13 MED ORDER — LIDOCAINE 2% (20 MG/ML) 5 ML SYRINGE
INTRAMUSCULAR | Status: DC | PRN
  Administered 2019-09-13: 80 mg via INTRAVENOUS

## 2019-09-13 SURGICAL SUPPLY — 15 items

## 2019-09-13 NOTE — Progress Notes (Signed)
Patient started to have an allergic reaction once the iron product started. It was stopped and patient received only 7-10 cc's. No acute distress. Patient has had a previous allergic reaction in [place] and was not noted in the allergies. RN stayed with the patient after the infusion was started and patient told the nurse right away when the itching started. MD notified. Allergy added to the list. Pharmacist notified. Rn will continue to monitor.

## 2019-09-13 NOTE — Anesthesia Preprocedure Evaluation (Signed)
Anesthesia Evaluation  Patient identified by MRN, date of birth, ID band Patient awake    Reviewed: Allergy & Precautions, NPO status , Patient's Chart, lab work & pertinent test results  Airway Mallampati: II  TM Distance: >3 FB     Dental   Pulmonary asthma , Current Smoker and Patient abstained from smoking.,    breath sounds clear to auscultation       Cardiovascular hypertension,  Rhythm:Regular Rate:Normal     Neuro/Psych    GI/Hepatic Neg liver ROS, hiatal hernia, PUD,   Endo/Other    Renal/GU negative Renal ROS     Musculoskeletal   Abdominal   Peds  Hematology  (+) anemia ,   Anesthesia Other Findings   Reproductive/Obstetrics                             Anesthesia Physical Anesthesia Plan  ASA: III  Anesthesia Plan: MAC   Post-op Pain Management:    Induction: Intravenous  PONV Risk Score and Plan: Ondansetron, Midazolam and Propofol infusion  Airway Management Planned: Nasal Cannula and Simple Face Mask  Additional Equipment:   Intra-op Plan:   Post-operative Plan:   Informed Consent: I have reviewed the patients History and Physical, chart, labs and discussed the procedure including the risks, benefits and alternatives for the proposed anesthesia with the patient or authorized representative who has indicated his/her understanding and acceptance.     Dental advisory given  Plan Discussed with: CRNA and Anesthesiologist  Anesthesia Plan Comments:         Anesthesia Quick Evaluation

## 2019-09-13 NOTE — Progress Notes (Signed)
[facility]                                                                              Patient Demographics  Carmen Ramos, is a 44 y.o. female, DOB - 04/23/1976, BJY:[id number]  Admit date - 09/12/2019   Admitting Physician Alessandra Bevels, MD  Outpatient Primary MD for the patient is System, Pcp Not In  Outpatient specialists:   LOS - 0  days   Medical records reviewed and are as summarized below:    Chief Complaint  Patient presents with  . Emesis  . Dizziness       Brief summary   Patient is a 44 year old female with history of hypertension, hiatal hernia, asthma, peptic ulcer disease, esophagitis, on oral PPI, chronic anemia with iron supplements presented with melena and dizziness associated with left upper quadrant abdominal pain worsens with food intake.  Patient reported symptom onset for about a week, progressing to discolored stools and occasional vomiting.  Reports compliance with PPI twice daily and iron supplements. At the time of admission WBC is 10.1, hemoglobin 5.2, per patient baseline 7-8, in 2018 was 11.2.  Creatinine 0.8, FOBT positive CT abdomen pelvis showed diverticulosis, moderate paraesophageal hernia with finding suggestive of distal esophagitis and reflux. GI was consulted, patient was admitted for further work-up.    Assessment & Plan    Principal Problem:   Upper GI bleed, acute on chronic iron deficiency anemia secondary to blood loss -Presented with a hemoglobin of 5.2, patient was placed on IV PPI drip, was started on 2 units packed RBC transfusion -GI was consulted. -Patient underwent EGD today, showed esophageal ulcers with no bleeding and no stigmata of recent bleeding, grade 4 hiatal hernia, normal duodenum. -GI recommended Carafate 1 g p.o. twice daily for a month, Protonix 40 mg p.o. twice daily indefinitely -Transfuse to keep hemoglobin above 7 -Hemoglobin 7 this morning, will transfuse 1 more unit of packed  RBCs, -Anemia panel showed ferritin 2, percent saturation ratio 3, iron 17, will give infusion of Feraheme x1  Active Problems:  Acute anemia secondary to GI bleed, iron deficiency anemia due to chronic blood loss -Iron stores very low, IV infusion of Feraheme x1 today -Continue oral iron supplement outpatient     HTN (hypertension) -Currently stable    Hiatal hernia -Continue antireflux regimen indefinitely, PPI twice daily, sucralfate for 1 month    Asthma, chronic, unspecified asthma severity, with acute exacerbation -No wheezing, currently stable  Obesity Estimated body mass index is 43.62 kg/m as calculated from the following:   Height as of this encounter: 5\' 3"  (1.6 m).   Weight as of this encounter: 111.7 kg.  Code Status: Full CODE STATUS DVT Prophylaxis: SCDs Family Communication: Discussed all imaging results, lab results, explained to the patient    Disposition Plan: Patient from home, anticipated discharge home, hemoglobin still low 7.0, transfusing one 1 unit packed RBC, IV Feraheme x1.  Follow CBC in a.m., if > 7.5, likely DC home   Time Spent in minutes   35 minutes  Procedures:  EGD  Consultants:   GI   Antimicrobials:   Anti-infectives (From admission,  onward)   None          Medications  Scheduled Meds: . sodium chloride   Intravenous Once  . loratadine  10 mg Oral Daily  . pantoprazole  40 mg Oral BID AC  . sodium chloride flush  3 mL Intravenous Once  . sucralfate  1 g Oral BID  . vitamin B-12  1,000 mcg Oral Daily   Continuous Infusions: . sodium chloride    . ferumoxytol     PRN Meds:.acetaminophen, albuterol, HYDROcodone-acetaminophen, ondansetron **OR** ondansetron (ZOFRAN) IV, oxyCODONE-acetaminophen, polyvinyl alcohol      Subjective:   Carmen Ramos was seen and examined today.  No acute complaints, seen prior to EGD.  No acute nausea or vomiting.  Abdominal pain improving.  Patient denies dizziness, chest pain,  shortness of breath, new weakness, numbess, tingling. No acute events overnight.    Objective:   Vitals:   09/13/19 0920 09/13/19 0930 09/13/19 1001 09/13/19 1004  BP: (!) 129/47 130/68  115/63  Pulse: 71 69 69 68  Resp: 18 17  18   Temp:    97.9 F (36.6 C)  TempSrc:    Oral  SpO2: 100% 98% 100% 100%  Weight:      Height:        Intake/Output Summary (Last 24 hours) at 09/13/2019 1047 Last data filed at 09/13/2019 0900 Gross per 24 hour  Intake 3441.37 ml  Output --  Net 3441.37 ml     Wt Readings from Last 3 Encounters:  09/12/19 111.7 kg  07/02/17 113.4 kg  12/31/16 104.3 kg     Exam  General: Alert and oriented x 3, NAD  Cardiovascular: S1 S2 auscultated, no murmurs, RRR  Respiratory: Clear to auscultation bilaterally, no wheezing, rales or rhonchi  Gastrointestinal: Soft, nontender, nondistended, + bowel sounds  Ext: no pedal edema bilaterally  Neuro: No new deficits  Musculoskeletal: No digital cyanosis, clubbing  Skin: No rashes  Psych: Normal affect and demeanor, alert and oriented x3    Data Reviewed:  I have personally reviewed following labs and imaging studies  Micro Results Recent Results (from the past 240 hour(s))  SARS CORONAVIRUS 2 (TAT 6-24 HRS) Nasopharyngeal Nasopharyngeal Swab     Status: None   Collection Time: 09/12/19  2:39 PM   Specimen: Nasopharyngeal Swab  Result Value Ref Range Status   SARS Coronavirus 2 NEGATIVE NEGATIVE Final    Comment: (NOTE) SARS-CoV-2 target nucleic acids are NOT DETECTED. The SARS-CoV-2 RNA is generally detectable in upper and lower respiratory specimens during the acute phase of infection. Negative results do not preclude SARS-CoV-2 infection, do not rule out co-infections with other pathogens, and should not be used as the sole basis for treatment or other patient management decisions. Negative results must be combined with clinical observations, patient history, and epidemiological information.  The expected result is Negative. Fact Sheet for Patients: 11/12/19 Fact Sheet for Healthcare Providers: HairSlick.no This test is not yet approved or cleared by the quierodirigir.com FDA and  has been authorized for detection and/or diagnosis of SARS-CoV-2 by FDA under an Emergency Use Authorization (EUA). This EUA will remain  in effect (meaning this test can be used) for the duration of the COVID-19 declaration under Section 56 4(b)(1) of the Act, 21 U.S.C. section 360bbb-3(b)(1), unless the authorization is terminated or revoked sooner. Performed at [facility], [address], [place], [place]   Surgical pcr screen     Status: Abnormal   Collection Time: 09/13/19  1:42 AM   Specimen: Nasal Mucosa; Nasal Swab  Result Value Ref Range Status   MRSA, PCR NEGATIVE NEGATIVE Final   Staphylococcus aureus POSITIVE (A) NEGATIVE Final    Comment: RESULT CALLED TO, READ BACK BY AND VERIFIED WITH: CUMMINS, RN ON 09/13/19 @ 0344 BY LE (NOTE) The Xpert SA Assay (FDA approved for NASAL specimens in patients 46 years of age and older), is one component of a comprehensive surveillance program. It is not intended to diagnose infection nor to guide or monitor treatment. Performed at [facility], [address], [place], [place] [address]     Radiology Reports CT ABDOMEN PELVIS W CONTRAST  Result Date: 09/12/2019 CLINICAL DATA:  Abdominal distension, vomiting EXAM: CT ABDOMEN AND PELVIS WITH CONTRAST TECHNIQUE: Multidetector CT imaging of the abdomen and pelvis was performed using the standard protocol following bolus administration of intravenous contrast. CONTRAST:  OMNIPAQUE IOHEXOL 300 MG/ML  SOLN COMPARISON:  July 03, 2017 FINDINGS: Lower chest: The visualized heart size within normal limits. No pericardial fluid/thickening. There is a moderate paraesophageal hernia seen. Within the distal  esophagus there appears to be a small amount of refluxed material. There is question of mild thickening seen within the distal GE junction. The visualized portions of the lungs are clear. Hepatobiliary: The liver is normal in density without focal abnormality.The main portal vein is patent. No evidence of calcified gallstones, gallbladder wall thickening or biliary dilatation. Pancreas: Unremarkable. No pancreatic ductal dilatation or surrounding inflammatory changes. Spleen: Normal in size without focal abnormality. Adrenals/Urinary Tract: Both adrenal glands appear normal. The kidneys and collecting system appear normal without evidence of urinary tract calculus or hydronephrosis. Bladder is unremarkable. Stomach/Bowel: There is a moderate paraesophageal hernia as described above. The small bowel and colon are normal in size and contour. There is scattered colonic diverticula present. Vascular/Lymphatic: There are no enlarged mesenteric, retroperitoneal, or pelvic lymph nodes. No significant vascular findings are present. Reproductive: The uterus and adnexa are unremarkable. Other: A small fat containing anterior umbilical hernia is present. Musculoskeletal: No acute or significant osseous findings. IMPRESSION: Moderate paraesophageal hernia with findings suggestive of distal esophagitis and reflux. Diverticulosis without diverticulitis. Electronically Signed   By: Jonna Clark M.D.   On: 09/12/2019 15:36    Lab Data:  CBC: Recent Labs  Lab 09/12/19 1337 09/13/19 0422  WBC 10.1  --   NEUTROABS 6.8  --   HGB 5.2* 7.0*  HCT 21.9* 27.6*  MCV 66.0*  --   PLT 408*  --    Basic Metabolic Panel: Recent Labs  Lab 09/12/19 1337 09/13/19 0422  NA 136 138  K 3.8 3.9  CL 103 105  CO2 22 21*  GLUCOSE 91 87  BUN 24* 15  CREATININE 0.88 0.73  CALCIUM 9.2 8.6*   GFR: Estimated Creatinine Clearance: 106.7 mL/min (by C-G formula based on SCr of 0.73 mg/dL). Liver Function Tests: Recent Labs  Lab  09/12/19 1337  AST 14*  ALT 17  ALKPHOS 58  BILITOT 0.3  PROT 8.3*  ALBUMIN 4.2   Recent Labs  Lab 09/12/19 1337  LIPASE 35   No results for input(s): AMMONIA in the last 168 hours. Coagulation Profile: No results for input(s): INR, PROTIME in the last 168 hours. Cardiac Enzymes: No results for input(s): CKTOTAL, CKMB, CKMBINDEX, TROPONINI in the last 168 hours. BNP (last 3 results) No results for input(s): PROBNP in the last 8760 hours. HbA1C: No results for input(s): HGBA1C in the last 72 hours. CBG:  No results for input(s): GLUCAP in the last 168 hours. Lipid Profile: No results for input(s): CHOL, HDL, LDLCALC, TRIG, CHOLHDL, LDLDIRECT in the last 72 hours. Thyroid Function Tests: No results for input(s): TSH, T4TOTAL, FREET4, T3FREE, THYROIDAB in the last 72 hours. Anemia Panel: Recent Labs    09/12/19 2203  FERRITIN 2*  TIBC 552*  IRON 17*   Urine analysis:    Component Value Date/Time   COLORURINE STRAW (A) 09/12/2019 1336   APPEARANCEUR CLEAR 09/12/2019 1336   LABSPEC 1.009 09/12/2019 1336   PHURINE 5.0 09/12/2019 1336   GLUCOSEU NEGATIVE 09/12/2019 1336   HGBUR NEGATIVE 09/12/2019 1336   BILIRUBINUR NEGATIVE 09/12/2019 1336   KETONESUR NEGATIVE 09/12/2019 1336   PROTEINUR NEGATIVE 09/12/2019 1336   NITRITE NEGATIVE 09/12/2019 1336   LEUKOCYTESUR NEGATIVE 09/12/2019 1336     Carmen Ramos M.D. [facility] 09/13/2019, 10:47 AM   Call night coverage person covering after 7pm

## 2019-09-13 NOTE — Anesthesia Postprocedure Evaluation (Signed)
Anesthesia Post Note  Patient: Carmen Ramos  Procedure(s) Performed: ESOPHAGOGASTRODUODENOSCOPY (EGD) WITH PROPOFOL (N/A ) BIOPSY     Patient location during evaluation: Endoscopy Anesthesia Type: MAC Level of consciousness: awake Pain management: pain level controlled Vital Signs Assessment: post-procedure vital signs reviewed and stable Respiratory status: spontaneous breathing Cardiovascular status: stable Postop Assessment: no apparent nausea or vomiting Anesthetic complications: no    Last Vitals:  Vitals:   09/13/19 0920 09/13/19 0930  BP: (!) 129/47 130/68  Pulse: 71 69  Resp: 18 17  Temp:    SpO2: 100% 98%    Last Pain:  Vitals:   09/13/19 0903  TempSrc: Oral  PainSc: 0-No pain                 Ashwath Lasch

## 2019-09-13 NOTE — Progress Notes (Signed)
Patient transferred to endoscopy with the blood transfusion.

## 2019-09-13 NOTE — Op Note (Signed)
[facility] Patient Name: Carmen Ramos Procedure Date: 09/13/2019 MRN: [id number] Attending MD: Napoleon Form , MD Date of Birth: 11-Jun-1976 CSN: [id number] Age: 44 Admit Type: Inpatient Procedure:                Upper GI endoscopy Indications:              Recent gastrointestinal bleeding, Suspected upper                            gastrointestinal bleeding, Iron deficiency anemia                            due to suspected upper gastrointestinal bleeding Providers:                Napoleon Form, MD, Calton Golds, Technician, Sherron Flemings, CRNA Referring MD:              Medicines:                Monitored Anesthesia Care Complications:            No immediate complications. Estimated Blood Loss:     Estimated blood loss was minimal. Procedure:                Pre-Anesthesia Assessment:                           - Prior to the procedure, a History and Physical                            was performed, and patient medications and                            allergies were reviewed. The patient's tolerance of                            previous anesthesia was also reviewed. The risks                            and benefits of the procedure and the sedation                            options and risks were discussed with the patient.                            All questions were answered, and informed consent                            was obtained. Prior Anticoagulants: The patient has                            taken no previous anticoagulant or antiplatelet  agents. ASA Grade Assessment: III - A patient with                            severe systemic disease. After reviewing the risks                            and benefits, the patient was deemed in                            satisfactory condition to undergo the procedure.                           After obtaining informed consent, the  endoscope was                            passed under direct vision. Throughout the                            procedure, the patient's blood pressure, pulse, and                            oxygen saturations were monitored continuously. The                            [id number]-H190 ([id number]) Olympus gastroscope was                            introduced through the mouth, and advanced to the                            second part of duodenum. The upper GI endoscopy was                            accomplished without difficulty. The patient                            tolerated the procedure well. Scope In: Scope Out: Findings:      Few cratered esophageal ulcers with no bleeding and no stigmata of       recent bleeding were found 34 to 35 cm from the incisors. The largest       lesion was 5 mm in largest dimension.      The gastroesophageal flap valve was visualized endoscopically and       classified as Hill Grade IV (no fold, wide open lumen, hiatal hernia       present).      A large hiatal hernia with paraesophageal hernia was present.      The cardia and gastric fundus were normal on retroflexion otherwise.      The first portion of the duodenum and second portion of the duodenum       were normal. Biopsies for histology were taken with a cold forceps for       evaluation of celiac disease. Impression:               - Esophageal ulcers with no bleeding and no  stigmata of recent bleeding.                           - Gastroesophageal flap valve classified as Hill                            Grade IV (no fold, wide open lumen, hiatal hernia                            present).                           - Large hiatal hernia.                           - Normal first portion of the duodenum and second                            portion of the duodenum. Biopsied. Moderate Sedation:      Not Applicable - Patient had care per Anesthesia. Recommendation:           -  Patient has a contact number available for                            emergencies. The signs and symptoms of potential                            delayed complications were discussed with the                            patient. Return to normal activities tomorrow.                            Written discharge instructions were provided to the                            patient.                           - Resume previous diet.                           - Continue present medications.                           - Await pathology results.                           - Use sucralfate suspension 1 gram PO BID X 1 month.                           - Use Protonix (pantoprazole) 40 mg PO BID.                           - Follow an antireflux regimen indefinitely.                           -  Monitor Hgb and transfuse as needed to maintain                            Hgb >7                           - If Hgb stable s/p transfusion ok to discharge                            home from GI standpoint later today                           - Continue oral iron therapy                           - Follow up CBC in 1-2 weeks by PMD office and call                            to make follow up appointment with GI next                            available after discharge.                           - GI will sign off, available if have any questions Procedure Code(s):        --- Professional ---                           [phone], Esophagogastroduodenoscopy, flexible,                            transoral; with biopsy, single or multiple Diagnosis Code(s):        --- Professional ---                           K22.10, Ulcer of esophagus without bleeding                           K44.9, Diaphragmatic hernia without obstruction or                            gangrene                           K92.2, Gastrointestinal hemorrhage, unspecified                           D50.9, Iron deficiency anemia, unspecified CPT copyright  2019 American Medical Association. All rights reserved. The codes documented in this report are preliminary and upon coder review may  be revised to meet current compliance requirements. Mauri Pole, MD 09/13/2019 9:13:59 AM This report has been signed electronically. Number of Addenda: 0

## 2019-09-13 NOTE — Interval H&P Note (Signed)
History and Physical Interval Note:  09/13/2019 8:24 AM  Carmen Ramos  has presented today for surgery, with the diagnosis of Anemia and heme positive stool.  The various methods of treatment have been discussed with the patient and family. After consideration of risks, benefits and other options for treatment, the patient has consented to  Procedure(s): ESOPHAGOGASTRODUODENOSCOPY (EGD) WITH PROPOFOL (N/A) as a surgical intervention.  The patient's history has been reviewed, patient examined, no change in status, stable for surgery.  I have reviewed the patient's chart and labs.  Questions were answered to the patient's satisfaction.     Tramaine Snell

## 2019-09-13 NOTE — Transfer of Care (Signed)
Immediate Anesthesia Transfer of Care Note  Patient: Carmen Ramos  Procedure(s) Performed: Procedure(s): ESOPHAGOGASTRODUODENOSCOPY (EGD) WITH PROPOFOL (N/A) BIOPSY  Patient Location: PACU and Endoscopy Unit  Anesthesia Type:General  Level of Consciousness: awake, alert  and oriented  Airway & Oxygen Therapy: Patient Spontanous Breathing and Patient connected to nasal cannula oxygen  Post-op Assessment: Report given to RN and Post -op Vital signs reviewed and stable  Post vital signs: Reviewed and stable  Last Vitals:  Vitals:   09/13/19 0834 09/13/19 0903  BP: 132/68   Pulse: 78 95  Resp: 18 15  Temp: 36.9 C   SpO2:  100%    Complications: No apparent anesthesia complications

## 2019-09-14 ENCOUNTER — Encounter: Payer: Self-pay | Admitting: *Deleted

## 2019-09-14 LAB — BASIC METABOLIC PANEL
Anion gap: 10 (ref 5–15)
BUN: 17 mg/dL (ref 6–20)
CO2: 22 mmol/L (ref 22–32)
Calcium: 9.1 mg/dL (ref 8.9–10.3)
Chloride: 103 mmol/L (ref 98–111)
Creatinine, Ser: 0.73 mg/dL (ref 0.44–1.00)
GFR calc Af Amer: >60 mL/min (ref >60)
GFR calc non Af Amer: >60 mL/min (ref >60)
Glucose, Bld: 108 mg/dL — ABNORMAL HIGH (ref 70–99)
Potassium: 4.1 mmol/L (ref 3.5–5.1)
Sodium: 135 mmol/L (ref 135–145)

## 2019-09-14 LAB — CBC
HCT: 28 % — ABNORMAL LOW (ref 36.0–46.0)
Hemoglobin: 7.8 g/dL — ABNORMAL LOW (ref 12.0–15.0)
MCH: 19.9 pg — ABNORMAL LOW (ref 26.0–34.0)
MCHC: 27.9 g/dL — ABNORMAL LOW (ref 30.0–36.0)
MCV: 71.4 fL — ABNORMAL LOW (ref 80.0–100.0)
Platelets: 315 10*3/uL (ref 150–400)
RBC: 3.92 MIL/uL (ref 3.87–5.11)
RDW: 25.2 % — ABNORMAL HIGH (ref 11.5–15.5)
WBC: 10.8 10*3/uL — ABNORMAL HIGH (ref 4.0–10.5)
nRBC: 0 % (ref 0.0–0.2)

## 2019-09-14 MED ORDER — PANTOPRAZOLE SODIUM 40 MG PO TBEC: 40.0000 mg | DELAYED_RELEASE_TABLET | Freq: Two times a day (BID) | ORAL | 3 refills | Status: DC

## 2019-09-14 MED ORDER — SUCRALFATE 1 GM/10ML PO SUSP: 1.0000 g | Freq: Two times a day (BID) | ORAL | 0 refills | Status: DC

## 2019-09-14 MED ORDER — MELATONIN 5 MG PO TABS
10.0000 mg | ORAL_TABLET | Freq: Once | ORAL | Status: AC
  Administered 2019-09-14: 01:00:00 10 mg via ORAL
  Filled 2019-09-14: qty 2

## 2019-09-14 NOTE — Discharge Summary (Signed)
Physician Discharge Summary   Patient ID: Carmen Ramos MRN: [id number] DOB/AGE: 07-28-1975 44 y.o.  Admit date: 09/12/2019 Discharge date: 09/14/2019  Primary Care Physician:  System, Pcp Not In   Recommendations for Outpatient Follow-up:  1. Follow up with PCP in 1-2 weeks 2. Patient to follow-up with Dr. Mosetta Putt for further work-up of chronic microcytic anemia, office will call for appointment 3. Follow-up outpatient with GI.  Recommended Protonix 40 mg twice daily, Carafate 1 g p.o. twice daily for 1 month  Home Health: At baseline, ambulating Equipment/Devices:   Discharge Condition: stable CODE STATUS: FULL  Diet recommendation: Heart healthy diet   Discharge Diagnoses:    . Upper GI bleed  Acute on chronic iron deficiency anemia secondary to acute blood loss . Iron deficiency anemia due to chronic blood loss . Essential hypertension   Hiatal hernia   Asthma   Obesity   Consults: Gastroenterology, Dr. Lavon Paganini    Allergies:   Allergies  Allergen Reactions  . Ferumoxytol Itching  . Gluten Meal     Arthritis, IBS  . Latex Rash     DISCHARGE MEDICATIONS: Allergies as of 09/14/2019      Reactions   Ferumoxytol Itching   Gluten Meal    Arthritis, IBS   Latex Rash      Medication List    TAKE these medications   acetaminophen 325 MG tablet Commonly known as: TYLENOL Take 650 mg by mouth every 6 (six) hours as needed for moderate pain.   carboxymethylcellulose 0.5 % Soln Commonly known as: REFRESH PLUS Place 1 drop into both eyes 3 (three) times daily as needed (dry eyes).   cetirizine 10 MG tablet Commonly known as: ZYRTEC Take 10 mg by mouth daily as needed for allergies.   citalopram 20 MG tablet Commonly known as: CELEXA Take 30 mg by mouth daily.   ferrous sulfate 325 (65 FE) MG tablet Take 325 mg by mouth 3 (three) times daily with meals.   lisinopril-hydrochlorothiazide 20-25 MG tablet Commonly known as: ZESTORETIC Take 1 tablet by  mouth daily.   metoprolol succinate 25 MG 24 hr tablet Commonly known as: TOPROL-XL Take 25 mg by mouth daily.   pantoprazole 40 MG tablet Commonly known as: PROTONIX Take 1 tablet (40 mg total) by mouth 2 (two) times daily before a meal. What changed: when to take this   sucralfate 1 GM/10ML suspension Commonly known as: CARAFATE Take 10 mLs (1 g total) by mouth 2 (two) times daily. Can substitute to tabs if suspension not available.   VITAMIN B-12 PO Take 1 tablet by mouth daily.        Brief H and P: For complete details please refer to admission H and P, but in brief *Patient is a 44 year old female with history of hypertension, hiatal hernia, asthma, peptic ulcer disease, esophagitis, on oral PPI, chronic anemia with iron supplements presented with melena and dizziness associated with left upper quadrant abdominal pain worsens with food intake.  Patient reported symptom onset for about a week, progressing to discolored stools and occasional vomiting.  Reports compliance with PPI twice daily and iron supplements. At the time of admission WBC is 10.1, hemoglobin 5.2, per patient baseline 7-8, in 2018 was 11.2.  Creatinine 0.8, FOBT positive CT abdomen pelvis showed diverticulosis, moderate paraesophageal hernia with finding suggestive of distal esophagitis and reflux. GI was consulted, patient was admitted for further work-up.   Hospital Course:   Upper GI bleed, acute on chronic iron deficiency anemia secondary  to blood loss -Presented with a hemoglobin of 5.2, patient was placed on IV PPI drip, was started on 2 units packed RBC transfusion -GI was consulted.  Patient underwent EGD on 3/13 which showed esophageal ulcers with no bleeding and no stigmata of recent bleeding, grade 4 hiatal hernia, normal duodenum. -GI recommended Carafate 1 g p.o. twice daily for a month, Protonix 40 mg p.o. twice daily indefinitely -Patient was transfused 1 more unit of packed RBCs on  3/13 -Hemoglobin 7 this morning, will transfuse 1 more unit of packed RBCs, -Ferritin low at 2, percent saturation ratio 3, iron 17, attempted to give 1 dose of Feraheme, however patient had allergic reaction. -Discussed in detail with the patient, she states that she has chronic anemia with baseline hemoglobin 7-8, unclear etiology, started after the birth of her son about 6 years ago.  Patient has seen hematology in [place] however no clear etiology was found.  Patient has received multiple transfusions for the anemia. -Discussed with Dr. Mosetta Putt, hematology on-call, will see outpatient for work-up.  Office will call for appointment, this was explained in detail to the patient. -Continue oral iron supplementation, patient states she is able to tolerate Hemoglobin 7.8 at the time of discharge, patient reports is within her baseline of 7-8  Acute anemia secondary to GI bleed, iron deficiency anemia due to chronic blood loss -Iron stores very low, patient was not able to tolerate IV Feraheme -Continue oral iron supplement outpatient     HTN (hypertension) -Currently stable    Hiatal hernia -Continue antireflux regimen indefinitely, PPI twice daily, sucralfate for 1 month    Asthma, chronic, unspecified asthma severity, with acute exacerbation -No wheezing, currently stable  Obesity Estimated body mass index is 43.62 kg/m as calculated from the following:   Height as of this encounter: 5\' 3"  (1.6 m).   Weight as of this encounter: 111.7 kg.   Day of Discharge S: No acute complaints, hoping to go home today.  No dizziness or lightheadedness.  No chest pain or shortness of breath.  BP 139/64 (BP Location: Left Arm)   Pulse 86   Temp 98.2 F (36.8 C) (Oral)   Resp 20   Ht 5\' 3"  (1.6 m)   Wt 111.7 kg   SpO2 91%   BMI 43.62 kg/m   Physical Exam: General: Alert and awake oriented x3 not in any acute distress. HEENT: anicteric sclera, pupils reactive to light and  accommodation CVS: S1-S2 clear no murmur rubs or gallops Chest: clear to auscultation bilaterally, no wheezing rales or rhonchi Abdomen: soft nontender, nondistended, normal bowel sounds Extremities: no cyanosis, clubbing or edema noted bilaterally Neuro: Cranial nerves II-XII intact, no focal neurological deficits    Get Medicines reviewed and adjusted: Please take all your medications with you for your next visit with your Primary MD  Please request your Primary MD to go over all hospital tests and procedure/radiological results at the follow up. Please ask your Primary MD to get all Hospital records sent to his/her office.  If you experience worsening of your admission symptoms, develop shortness of breath, life threatening emergency, suicidal or homicidal thoughts you must seek medical attention immediately by calling 911 or calling your MD immediately  if symptoms less severe.  You must read complete instructions/literature along with all the possible adverse reactions/side effects for all the Medicines you take and that have been prescribed to you. Take any new Medicines after you have completely understood and accept all the possible adverse reactions/side  effects.   Do not drive when taking pain medications.   Do not take more than prescribed Pain, Sleep and Anxiety Medications  Special Instructions: If you have smoked or chewed Tobacco  in the last 2 yrs please stop smoking, stop any regular Alcohol  and or any Recreational drug use.  Wear Seat belts while driving.  Please note  You were cared for by a hospitalist during your hospital stay. Once you are discharged, your primary care physician will handle any further medical issues. Please note that NO REFILLS for any discharge medications will be authorized once you are discharged, as it is imperative that you return to your primary care physician (or establish a relationship with a primary care physician if you do not have one)  for your aftercare needs so that they can reassess your need for medications and monitor your lab values.   The results of significant diagnostics from this hospitalization (including imaging, microbiology, ancillary and laboratory) are listed below for reference.      Procedures/Studies:  CT ABDOMEN PELVIS W CONTRAST  Result Date: 09/12/2019 CLINICAL DATA:  Abdominal distension, vomiting EXAM: CT ABDOMEN AND PELVIS WITH CONTRAST TECHNIQUE: Multidetector CT imaging of the abdomen and pelvis was performed using the standard protocol following bolus administration of intravenous contrast. CONTRAST:  114mL OMNIPAQUE IOHEXOL 300 MG/ML  SOLN COMPARISON:  July 03, 2017 FINDINGS: Lower chest: The visualized heart size within normal limits. No pericardial fluid/thickening. There is a moderate paraesophageal hernia seen. Within the distal esophagus there appears to be a small amount of refluxed material. There is question of mild thickening seen within the distal GE junction. The visualized portions of the lungs are clear. Hepatobiliary: The liver is normal in density without focal abnormality.The main portal vein is patent. No evidence of calcified gallstones, gallbladder wall thickening or biliary dilatation. Pancreas: Unremarkable. No pancreatic ductal dilatation or surrounding inflammatory changes. Spleen: Normal in size without focal abnormality. Adrenals/Urinary Tract: Both adrenal glands appear normal. The kidneys and collecting system appear normal without evidence of urinary tract calculus or hydronephrosis. Bladder is unremarkable. Stomach/Bowel: There is a moderate paraesophageal hernia as described above. The small bowel and colon are normal in size and contour. There is scattered colonic diverticula present. Vascular/Lymphatic: There are no enlarged mesenteric, retroperitoneal, or pelvic lymph nodes. No significant vascular findings are present. Reproductive: The uterus and adnexa are unremarkable.  Other: A small fat containing anterior umbilical hernia is present. Musculoskeletal: No acute or significant osseous findings. IMPRESSION: Moderate paraesophageal hernia with findings suggestive of distal esophagitis and reflux. Diverticulosis without diverticulitis. Electronically Signed   By: Prudencio Pair M.D.   On: 09/12/2019 15:36       LAB RESULTS: Basic Metabolic Panel: Recent Labs  Lab 09/13/19 0422 09/14/19 0818  NA 138 135  K 3.9 4.1  CL 105 103  CO2 21* 22  GLUCOSE 87 108*  BUN 15 17  CREATININE 0.73 0.73  CALCIUM 8.6* 9.1   Liver Function Tests: Recent Labs  Lab 09/12/19 1337  AST 14*  ALT 17  ALKPHOS 58  BILITOT 0.3  PROT 8.3*  ALBUMIN 4.2   Recent Labs  Lab 09/12/19 1337  LIPASE 35   No results for input(s): AMMONIA in the last 168 hours. CBC: Recent Labs  Lab 09/12/19 1337 09/12/19 1337 09/13/19 0422 09/13/19 1021 09/14/19 0818  WBC 10.1  --   --   --  10.8*  NEUTROABS 6.8  --   --   --   --  HGB 5.2*  --    < > 8.4* 7.8*  HCT 21.9*  --    < > 30.9* 28.0*  MCV 66.0*   < >  --   --  71.4*  PLT 408*  --   --   --  315   < > = values in this interval not displayed.   Cardiac Enzymes: No results for input(s): CKTOTAL, CKMB, CKMBINDEX, TROPONINI in the last 168 hours. BNP: Invalid input(s): POCBNP CBG: No results for input(s): GLUCAP in the last 168 hours.     Disposition and Follow-up: Discharge Instructions    Diet general   Complete by: As directed    Increase activity slowly   Complete by: As directed        DISPOSITION: Home   DISCHARGE FOLLOW-UP Follow-up Information    Napoleon Form, MD. Schedule an appointment as soon as possible for a visit in 4 week(s).   Specialty: Gastroenterology Contact information: [address] [place] [address] [phone]        Malachy Mood, MD. Schedule an appointment as soon as possible for a visit in 2 week(s).   Specialties: Hematology, Oncology Why: please call in  3 days for the appontment (hematology), if you have not received the call from the office.  Contact information: [address] [place] [address] [phone]            Time coordinating discharge:  35 minutes  Signed:   Thad Ranger M.D. [facility] 09/14/2019, 12:33 PM

## 2019-09-14 NOTE — Progress Notes (Signed)
Pt leaving this afternoon with her boyfriend. Alert, oriented, and without c/o. Discharge instructions given/explained with pt verbalizing understanding. Pt aware to followup.

## 2019-09-16 ENCOUNTER — Telehealth: Payer: Self-pay | Admitting: Nurse Practitioner

## 2019-09-16 ENCOUNTER — Telehealth: Payer: Self-pay | Admitting: Hematology and Oncology

## 2019-09-16 ENCOUNTER — Encounter: Payer: Self-pay | Admitting: Nurse Practitioner

## 2019-09-16 LAB — TYPE AND SCREEN
ABO/RH(D): A NEG
Antibody Screen: NEGATIVE
Unit division: 0
Unit division: 0
Unit division: 0
Unit division: 0

## 2019-09-16 LAB — BPAM RBC
Blood Product Expiration Date: 202104082359
Blood Product Expiration Date: 202104112359
Blood Product Expiration Date: 202104132359
Blood Product Expiration Date: 202104142359
ISSUE DATE / TIME: 202103121458
ISSUE DATE / TIME: 202103122219
ISSUE DATE / TIME: 202103130732
Unit Type and Rh: 600
Unit Type and Rh: 600
Unit Type and Rh: 600
Unit Type and Rh: 600

## 2019-09-16 LAB — SURGICAL PATHOLOGY

## 2019-09-16 NOTE — Telephone Encounter (Signed)
MS. Carmen Ramos cld to schedule a new hem appt with Lacie on 4/12 at 1:45pm w/labs at 1:15pm. Pt was seen in the hospital for anemia. She's aware to arrive 15 minutes early. Letter mailed.

## 2019-09-16 NOTE — Addendum Note (Signed)
Addendum  created 09/16/19 1524 by Dorris Singh, MD   Attestation recorded in Intraprocedure, Intraprocedure Attestations filed

## 2019-09-16 NOTE — Telephone Encounter (Signed)
Received an urgent genetics referral from Dr. Dwain Sarna for breast cancer. She cld back to reschedule her appt from 3/17 to 3/18 at 3pm. She's been made aware to arrive 15 minutes early.

## 2019-09-23 ENCOUNTER — Encounter: Payer: Self-pay | Admitting: Gastroenterology

## 2019-10-12 NOTE — Progress Notes (Deleted)
[facility]  Telephone:([phone]) [phone] Fax:([phone]) [facility] consult Note   Patient Care Team: System, Pcp Not In as PCP - General 10/13/2019  CHIEF COMPLAINTS/PURPOSE OF CONSULTATION:  Anemia, referred by hospitalist   HISTORY OF PRESENTING ILLNESS:  Carmen Ramos 44 y.o. female with history of HTN, hiatal hernia, asthmea, PUD with esophagitis on PPO and chronic anemia is here because of anemia. Treatment history includes oral iron and IV iron but she had allergic reactions. She presented to ED on 09/12/19 with melena, dizziness and post prandial LUQ pain. She was found to have significant drop in her baseline anemia (range 7-8, was 11 in 2018) down to 5.2, HCT 21.9, MCV low 66, and mild thrombocytopenia PLT 408K. Iron studies showed ferritin 2, serum iron 17, 3% transferrin saturation, and TIBC of 552 indicative of iron deficiency. FOBT was positive. She was admitted for further work up, transfused 3 units RBCs. CT AP on 09/12/19 showed moderate paraesophageal hernia with distal esophagitis and reflux, otherwise unrevealing. Inpatient EGD by Dr. Silverio Decamp showed nonbleeding esophageal ulcers and large grade 4 hiatal with esophageal hernia. Duodenal biopsy showed benign mucosa, no inflammation. She was started on carafate and PPI, also given given IV Feraheme but had reaction and required solumedrol. Highest Hgb during admission was 8.4.   ***She was found to have abnormal CBC from *** ***She denies recent chest pain on exertion, shortness of breath on minimal exertion, pre-syncopal episodes, or palpitations. ***She had not noticed any recent bleeding such as epistaxis, hematuria or hematochezia ***The patient denies over the counter NSAID ingestion. She is not *** on antiplatelets agents. Her last colonoscopy was *** ***She had no prior history or diagnosis of cancer. Her age appropriate screening programs are up-to-date. ***She denies any pica and eats a variety of  diet. ***She never donated blood or received blood transfusion ***The patient was prescribed oral iron supplements and she takes ***  MEDICAL HISTORY:  Past Medical History:  Diagnosis Date  . Anemia   . Asthma   . Bleeding ulcer   . Blood transfusion without reported diagnosis    4  . Hiatal hernia   . Hypertension     SURGICAL HISTORY: Past Surgical History:  Procedure Laterality Date  . ABDOMINAL HYSTERECTOMY    . BIOPSY  09/13/2019   Procedure: BIOPSY;  Surgeon: Mauri Pole, MD;  Location: [facility];  Service: Endoscopy;;  . CESAREAN SECTION    . ESOPHAGOGASTRODUODENOSCOPY (EGD) WITH PROPOFOL N/A 09/13/2019   Procedure: ESOPHAGOGASTRODUODENOSCOPY (EGD) WITH PROPOFOL;  Surgeon: Mauri Pole, MD;  Location: [facility];  Service: Endoscopy;  Laterality: N/A;  . TUBAL LIGATION      SOCIAL HISTORY: Social History   Socioeconomic History  . Marital status: Married    Spouse name: Not on file  . Number of children: Not on file  . Years of education: Not on file  . Highest education level: Not on file  Occupational History  . Not on file  Tobacco Use  . Smoking status: Current Some Day Smoker    Types: Cigars  . Smokeless tobacco: Never Used  Substance and Sexual Activity  . Alcohol use: No  . Drug use: No  . Sexual activity: Never  Other Topics Concern  . Not on file  Social History Narrative  . Not on file   Social Determinants of Health   Financial Resource Strain:   . Difficulty of Paying Living Expenses:   Food Insecurity:   . Worried  About Running Out of Food in the Last Year:   . Ran Out of Food in the Last Year:   Transportation Needs:   . Lack of Transportation (Medical):   Marland Kitchen Lack of Transportation (Non-Medical):   Physical Activity:   . Days of Exercise per Week:   . Minutes of Exercise per Session:   Stress:   . Feeling of Stress :   Social Connections:   . Frequency of Communication with Friends and Family:   . Frequency  of Social Gatherings with Friends and Family:   . Attends Religious Services:   . Active Member of Clubs or Organizations:   . Attends Banker Meetings:   Marland Kitchen Marital Status:   Intimate Partner Violence:   . Fear of Current or Ex-Partner:   . Emotionally Abused:   Marland Kitchen Physically Abused:   . Sexually Abused:     FAMILY HISTORY: Family History  Problem Relation Age of Onset  . Cancer Mother   . Parkinson's disease Father   . Dementia Father     ALLERGIES:  is allergic to ferumoxytol; gluten meal; and latex.  MEDICATIONS:  Current Outpatient Medications  Medication Sig Dispense Refill  . acetaminophen (TYLENOL) 325 MG tablet Take 650 mg by mouth every 6 (six) hours as needed for moderate pain.    . carboxymethylcellulose (REFRESH PLUS) 0.5 % SOLN Place 1 drop into both eyes 3 (three) times daily as needed (dry eyes).    . cetirizine (ZYRTEC) 10 MG tablet Take 10 mg by mouth daily as needed for allergies.    . citalopram (CELEXA) 20 MG tablet Take 30 mg by mouth daily.    . Cyanocobalamin (VITAMIN B-12 PO) Take 1 tablet by mouth daily.    . ferrous sulfate 325 (65 FE) MG tablet Take 325 mg by mouth 3 (three) times daily with meals.    Marland Kitchen lisinopril-hydrochlorothiazide (PRINZIDE,ZESTORETIC) 20-25 MG tablet Take 1 tablet by mouth daily.    . metoprolol succinate (TOPROL-XL) 25 MG 24 hr tablet Take 25 mg by mouth daily.    . pantoprazole (PROTONIX) 40 MG tablet Take 1 tablet (40 mg total) by mouth 2 (two) times daily before a meal. 60 tablet 3  . sucralfate (CARAFATE) 1 GM/10ML suspension Take 10 mLs (1 g total) by mouth 2 (two) times daily. Can substitute to tabs if suspension not available. 600 mL 0   No current facility-administered medications for this visit.    REVIEW OF SYSTEMS:   Constitutional: Denies fevers, chills or abnormal night sweats Eyes: Denies blurriness of vision, double vision or watery eyes Ears, nose, mouth, throat, and face: Denies mucositis or sore  throat Respiratory: Denies cough, dyspnea or wheezes Cardiovascular: Denies palpitation, chest discomfort or lower extremity swelling Gastrointestinal:  Denies nausea, heartburn or change in bowel habits Skin: Denies abnormal skin rashes Lymphatics: Denies new lymphadenopathy or easy bruising Neurological:Denies numbness, tingling or new weaknesses Behavioral/Psych: Mood is stable, no new changes  All other systems were reviewed with the patient and are negative.  PHYSICAL EXAMINATION: ECOG PERFORMANCE STATUS: {CHL ONC ECOG PS:[phone]}  There were no vitals filed for this visit. There were no vitals filed for this visit.  GENERAL:alert, no distress and comfortable SKIN: skin color, texture, turgor are normal, no rashes or significant lesions EYES: normal, conjunctiva are pink and non-injected, sclera clear OROPHARYNX:no exudate, no erythema and lips, buccal mucosa, and tongue normal  NECK: supple, thyroid normal size, non-tender, without nodularity LYMPH:  no palpable lymphadenopathy in the cervical,  axillary or inguinal LUNGS: clear to auscultation and percussion with normal breathing effort HEART: regular rate & rhythm and no murmurs and no lower extremity edema ABDOMEN:abdomen soft, non-tender and normal bowel sounds Musculoskeletal:no cyanosis of digits and no clubbing  PSYCH: alert & oriented x 3 with fluent speech NEURO: no focal motor/sensory deficits  LABORATORY DATA:  I have reviewed the data as listed CBC Latest Ref Rng & Units 09/14/2019 09/13/2019 09/13/2019  WBC 4.0 - 10.5 K/uL 10.8(H) - -  Hemoglobin 12.0 - 15.0 g/dL 7.8(L) 8.4(L) 7.0(L)  Hematocrit 36.0 - 46.0 % 28.0(L) 30.9(L) 27.6(L)  Platelets 150 - 400 K/uL 315 - -    CMP Latest Ref Rng & Units 09/14/2019 09/13/2019 09/12/2019  Glucose 70 - 99 mg/dL 354(S) 87 91  BUN 6 - 20 mg/dL 17 15 56(C)  Creatinine 0.44 - 1.00 mg/dL 1.27 5.17 0.01  Sodium 135 - 145 mmol/L 135 138 136  Potassium 3.5 - 5.1 mmol/L 4.1 3.9  3.8  Chloride 98 - 111 mmol/L 103 105 103  CO2 22 - 32 mmol/L 22 21(L) 22  Calcium 8.9 - 10.3 mg/dL 9.1 7.4(B) 9.2  Total Protein 6.5 - 8.1 g/dL - - 8.3(H)  Total Bilirubin 0.3 - 1.2 mg/dL - - 0.3  Alkaline Phos 38 - 126 U/L - - 58  AST 15 - 41 U/L - - 14(L)  ALT 0 - 44 U/L - - 17     RADIOGRAPHIC STUDIES: I have personally reviewed the radiological images as listed and agreed with the findings in the report. No results found.  ASSESSMENT & PLAN:  No problem-specific Assessment & Plan notes found for this encounter.    All questions were answered. The patient knows to call the clinic with any problems, questions or concerns. I spent {CHL ONC TIME VISIT - SWHQP:[id number]} counseling the patient face to face. The total time spent in the appointment was {CHL ONC TIME VISIT - LDJTT:[id number]} and more than 50% was on counseling.     Pollyann Samples, NP 10/13/19 11:54 AM

## 2019-10-13 ENCOUNTER — Inpatient Hospital Stay: Payer: BC Managed Care – PPO | Attending: Nurse Practitioner | Admitting: Nurse Practitioner

## 2019-10-13 ENCOUNTER — Inpatient Hospital Stay: Payer: BC Managed Care – PPO

## 2019-10-13 ENCOUNTER — Telehealth: Payer: Self-pay

## 2019-10-13 NOTE — Telephone Encounter (Signed)
TC to Pt. Inquiring about missed appointment Pt. Stated she called about 1030 to cancel appointment due to getting her second  Covid vaccine and doesn't feel well. Informed Pt. Scheduling message sent to reschedule appointment. And she should receive a call. Pt. Verbalized understanding

## 2019-11-07 ENCOUNTER — Other Ambulatory Visit: Payer: Self-pay

## 2019-11-07 ENCOUNTER — Emergency Department (HOSPITAL_COMMUNITY): Payer: BC Managed Care – PPO

## 2019-11-07 ENCOUNTER — Emergency Department (HOSPITAL_COMMUNITY)
Admission: EM | Admit: 2019-11-07 | Discharge: 2019-11-07 | Disposition: A | Discharge status: Home / Self Care | Payer: BC Managed Care – PPO | Attending: Emergency Medicine | Admitting: Emergency Medicine

## 2019-11-07 ENCOUNTER — Encounter (HOSPITAL_COMMUNITY): Payer: Self-pay

## 2019-11-07 DIAGNOSIS — R112 Nausea with vomiting, unspecified: Secondary | ICD-10-CM | POA: Diagnosis not present

## 2019-11-07 DIAGNOSIS — Z91040 Latex allergy status: Secondary | ICD-10-CM | POA: Diagnosis not present

## 2019-11-07 DIAGNOSIS — Z79899 Other long term (current) drug therapy: Secondary | ICD-10-CM | POA: Insufficient documentation

## 2019-11-07 DIAGNOSIS — F17290 Nicotine dependence, other tobacco product, uncomplicated: Secondary | ICD-10-CM | POA: Diagnosis not present

## 2019-11-07 DIAGNOSIS — R1013 Epigastric pain: Secondary | ICD-10-CM | POA: Diagnosis present

## 2019-11-07 DIAGNOSIS — I10 Essential (primary) hypertension: Secondary | ICD-10-CM | POA: Diagnosis not present

## 2019-11-07 DIAGNOSIS — J45909 Unspecified asthma, uncomplicated: Secondary | ICD-10-CM | POA: Insufficient documentation

## 2019-11-07 DIAGNOSIS — R109 Unspecified abdominal pain: Secondary | ICD-10-CM

## 2019-11-07 DIAGNOSIS — D509 Iron deficiency anemia, unspecified: Secondary | ICD-10-CM | POA: Diagnosis not present

## 2019-11-07 DIAGNOSIS — K921 Melena: Secondary | ICD-10-CM | POA: Insufficient documentation

## 2019-11-07 LAB — CBC
HCT: 30.8 % — ABNORMAL LOW (ref 36.0–46.0)
Hemoglobin: 8.5 g/dL — ABNORMAL LOW (ref 12.0–15.0)
MCH: 20.6 pg — ABNORMAL LOW (ref 26.0–34.0)
MCHC: 27.6 g/dL — ABNORMAL LOW (ref 30.0–36.0)
MCV: 74.8 fL — ABNORMAL LOW (ref 80.0–100.0)
Platelets: 350 10*3/uL (ref 150–400)
RBC: 4.12 MIL/uL (ref 3.87–5.11)
RDW: 19.9 % — ABNORMAL HIGH (ref 11.5–15.5)
WBC: 11.8 10*3/uL — ABNORMAL HIGH (ref 4.0–10.5)
nRBC: 0 % (ref 0.0–0.2)

## 2019-11-07 LAB — COMPREHENSIVE METABOLIC PANEL
ALT: 16 U/L (ref 0–44)
AST: 27 U/L (ref 15–41)
Albumin: 4 g/dL (ref 3.5–5.0)
Alkaline Phosphatase: 58 U/L (ref 38–126)
Anion gap: 11 (ref 5–15)
BUN: 14 mg/dL (ref 6–20)
CO2: 24 mmol/L (ref 22–32)
Calcium: 9.1 mg/dL (ref 8.9–10.3)
Chloride: 103 mmol/L (ref 98–111)
Creatinine, Ser: 0.69 mg/dL (ref 0.44–1.00)
GFR calc Af Amer: >60 mL/min (ref >60)
GFR calc non Af Amer: >60 mL/min (ref >60)
Glucose, Bld: 91 mg/dL (ref 70–99)
Potassium: 3.9 mmol/L (ref 3.5–5.1)
Sodium: 138 mmol/L (ref 135–145)
Total Bilirubin: 0.4 mg/dL (ref 0.3–1.2)
Total Protein: 7.7 g/dL (ref 6.5–8.1)

## 2019-11-07 LAB — URINALYSIS, ROUTINE W REFLEX MICROSCOPIC
Bilirubin Urine: NEGATIVE
Glucose, UA: NEGATIVE mg/dL
Hgb urine dipstick: NEGATIVE
Ketones, ur: NEGATIVE mg/dL
Leukocytes,Ua: NEGATIVE
Nitrite: NEGATIVE
Protein, ur: NEGATIVE mg/dL
Specific Gravity, Urine: 1.011 (ref 1.005–1.030)
pH: 5 (ref 5.0–8.0)

## 2019-11-07 LAB — I-STAT BETA HCG BLOOD, ED (MC, WL, AP ONLY): I-stat hCG, quantitative: <5 m[IU]/mL (ref <5)

## 2019-11-07 LAB — POC OCCULT BLOOD, ED: Fecal Occult Bld: NEGATIVE

## 2019-11-07 LAB — LIPASE, BLOOD: Lipase: 30 U/L (ref 11–51)

## 2019-11-07 MED ORDER — PANTOPRAZOLE SODIUM 40 MG IV SOLR
40.0000 mg | Freq: Once | INTRAVENOUS | Status: AC
  Administered 2019-11-07: 15:00:00 40 mg via INTRAVENOUS
  Filled 2019-11-07: qty 40

## 2019-11-07 MED ORDER — SUCRALFATE 1 G PO TABS: 1.0000 g | ORAL_TABLET | Freq: Three times a day (TID) | ORAL | 0 refills | Status: DC

## 2019-11-07 MED ORDER — SODIUM CHLORIDE 0.9% FLUSH: 3.0000 mL | Freq: Once | INTRAVENOUS | Status: DC

## 2019-11-07 MED ORDER — ONDANSETRON HCL 4 MG/2ML IJ SOLN
4.0000 mg | Freq: Once | INTRAMUSCULAR | Status: AC
  Administered 2019-11-07: 15:00:00 4 mg via INTRAVENOUS
  Filled 2019-11-07: qty 2

## 2019-11-07 MED ORDER — FENTANYL CITRATE (PF) 100 MCG/2ML IJ SOLN
50.0000 ug | Freq: Once | INTRAMUSCULAR | Status: AC
  Administered 2019-11-07: 15:00:00 50 ug via INTRAVENOUS
  Filled 2019-11-07: qty 2

## 2019-11-07 NOTE — ED Provider Notes (Signed)
3:50 PM-checkout from Dr. Stevie Kern to evaluate for evaluation of blood in stool and vomiting.  History of GI bleeding requiring transfusion in March 2021.  Bleeding was suspected to be upper, and she had endoscopy for evaluation.  No source of bleeding was found.  She did have esophageal ulcers, but he did not have evidence for bleeding.  She is maintained on PPI and sucralfate at that time.  She was instructed to follow-up with GI, within 2 weeks after discharge.  5:30 PM.  Patient states that her symptoms began with reflux, about a week ago.  She stopped taking Zocor for it because she ran out of it.  She has not yet seen her GI doctor in follow-up.  She plans on seeing a local hematologist.  She feels comfortable enough to go home.  I discussed all the findings with the patient.  Patient's husband in the room at the time.  I asked that she get a note to return to work tomorrow but another note to stay off if she needs to.   Mancel Bale, MD 11/07/19 [phone]

## 2019-11-07 NOTE — ED Notes (Signed)
ED Provider at bedside. 

## 2019-11-07 NOTE — ED Notes (Signed)
Patient transported to XR. 

## 2019-11-07 NOTE — ED Triage Notes (Signed)
Pt reports left sided abdominal pain and vomiting x2-3 days. Pt also reports dark stools and some blood in her vomit. Pt hx of hernia and ulcers.

## 2019-11-07 NOTE — ED Notes (Signed)
Family at bedside. 

## 2019-11-07 NOTE — Discharge Instructions (Signed)
Follow-up with your hematologist, and gastroenterologist for ongoing management of your anemia, and reflux symptoms.

## 2019-11-07 NOTE — ED Notes (Signed)
Patient aware urine sample is needed.  

## 2019-11-07 NOTE — ED Provider Notes (Signed)
[facility] DEPT Provider Note   CSN: [id number] Arrival date & time: 11/07/19  1243     History Chief Complaint  Patient presents with  . Abdominal Pain  . Emesis    Carmen Ramos is a 44 y.o. female.  Presents to ER with complaint of abdominal pain, nausea and vomiting.  Patient has past history of peptic ulcer disease, gastritis, GI bleed, hiatal hernia.  Patient reports for the last few days she has been having some mild nausea and occasional vomiting however today states that she had severe nausea, vomiting and some epigastric discomfort.  Sharp, stabbing pain.  Vomit mostly nonbloody but has noted some small streaks of blood in vomit.  Also over the last couple days has noted some darker appearing stools.  Complete chart review, noted admission in March for upper GI bleed, anemia 5, requiring transfusion.  HPI     Past Medical History:  Diagnosis Date  . Anemia   . Asthma   . Bleeding ulcer   . Blood transfusion without reported diagnosis    6  . Hiatal hernia   . Hypertension     Patient Active Problem List   Diagnosis Date Noted  . Acute upper GI bleed 09/13/2019  . Upper GI bleed 09/12/2019  . HTN (hypertension) 09/12/2019  . Hiatal hernia 09/12/2019  . Asthma, chronic, unspecified asthma severity, with acute exacerbation 09/12/2019  . Acute blood loss anemia 09/12/2019  . Melena   . Iron deficiency anemia due to chronic blood loss     Past Surgical History:  Procedure Laterality Date  . ABDOMINAL HYSTERECTOMY    . BIOPSY  09/13/2019   Procedure: BIOPSY;  Surgeon: Mauri Pole, MD;  Location: [facility];  Service: Endoscopy;;  . CESAREAN SECTION    . ESOPHAGOGASTRODUODENOSCOPY (EGD) WITH PROPOFOL N/A 09/13/2019   Procedure: ESOPHAGOGASTRODUODENOSCOPY (EGD) WITH PROPOFOL;  Surgeon: Mauri Pole, MD;  Location: [facility];  Service: Endoscopy;  Laterality: N/A;  . TUBAL LIGATION       OB History   No  obstetric history on file.     Family History  Problem Relation Age of Onset  . Cancer Mother   . Parkinson's disease Father   . Dementia Father     Social History   Tobacco Use  . Smoking status: Current Some Day Smoker    Types: Cigars  . Smokeless tobacco: Never Used  Substance Use Topics  . Alcohol use: No  . Drug use: No    Home Medications Prior to Admission medications   Medication Sig Start Date End Date Taking? Authorizing Provider  acetaminophen (TYLENOL) 325 MG tablet Take 650 mg by mouth every 6 (six) hours as needed for moderate pain.    [provider]  carboxymethylcellulose (REFRESH PLUS) 0.5 % SOLN Place 1 drop into both eyes 3 (three) times daily as needed (dry eyes).    [provider]  cetirizine (ZYRTEC) 10 MG tablet Take 10 mg by mouth daily as needed for allergies.    [provider]  citalopram (CELEXA) 20 MG tablet Take 30 mg by mouth daily. 08/25/19   [provider]  Cyanocobalamin (VITAMIN B-12 PO) Take 1 tablet by mouth daily.    [provider]  ferrous sulfate 325 (65 FE) MG tablet Take 325 mg by mouth 3 (three) times daily with meals.    [provider]  lisinopril-hydrochlorothiazide (PRINZIDE,ZESTORETIC) 20-25 MG tablet Take 1 tablet by mouth daily.    [provider]  metoprolol succinate (TOPROL-XL) 25 MG 24 hr tablet Take 25 mg by mouth daily. 09/01/19   [provider]  pantoprazole (PROTONIX) 40 MG tablet Take 1 tablet (40 mg total) by mouth 2 (two) times daily before a meal. 09/14/19   Rai, Ripudeep K, MD  sucralfate (CARAFATE) 1 GM/10ML suspension Take 10 mLs (1 g total) by mouth 2 (two) times daily. Can substitute to tabs if suspension not available. 09/14/19 10/14/19  Rai, Delene Ruffini, MD    Allergies    Ferumoxytol, Gluten meal, and Latex  Review of Systems   Review of Systems  Constitutional: Negative for chills and fever.  HENT: Negative for ear pain and sore  throat.   Eyes: Negative for pain and visual disturbance.  Respiratory: Negative for cough and shortness of breath.   Cardiovascular: Negative for chest pain and palpitations.  Gastrointestinal: Positive for abdominal pain, blood in stool, nausea and vomiting.  Genitourinary: Negative for dysuria and hematuria.  Musculoskeletal: Negative for arthralgias and back pain.  Skin: Negative for color change and rash.  Neurological: Negative for seizures and syncope.  All other systems reviewed and are negative.   Physical Exam Updated Vital Signs BP 126/65 (BP Location: Right Arm)   Pulse 73   Temp 98.5 F (36.9 C) (Oral)   Resp 18   SpO2 99%   Physical Exam Vitals and nursing note reviewed.  Constitutional:      General: She is not in acute distress.    Appearance: She is well-developed.  HENT:     Head: Normocephalic and atraumatic.  Eyes:     Conjunctiva/sclera: Conjunctivae normal.  Cardiovascular:     Rate and Rhythm: Normal rate and regular rhythm.     Heart sounds: No murmur.  Pulmonary:     Effort: Pulmonary effort is normal. No respiratory distress.     Breath sounds: Normal breath sounds.  Abdominal:     Palpations: Abdomen is soft.     Tenderness: There is abdominal tenderness in the epigastric area. There is no guarding or rebound.  Genitourinary:    Rectum: Normal. Guaiac result negative. No mass or tenderness.     Comments: Brown stool Musculoskeletal:     Cervical back: Neck supple.  Skin:    General: Skin is warm and dry.     Capillary Refill: Capillary refill takes less than 2 seconds.  Neurological:     Mental Status: She is alert.     ED Results / Procedures / Treatments   Labs (all labs ordered are listed, but only abnormal results are displayed) Labs Reviewed  LIPASE, BLOOD  COMPREHENSIVE METABOLIC PANEL  CBC  URINALYSIS, ROUTINE W REFLEX MICROSCOPIC  POC OCCULT BLOOD, ED  I-STAT BETA HCG BLOOD, ED ([facility], [facility], [facility] ONLY)  TYPE AND SCREEN     EKG None  Radiology No results found.  Procedures Procedures (including critical care time)  Medications Ordered in ED Medications  sodium chloride flush (NS) 0.9 % injection 3 mL (has no administration in time range)  pantoprazole (PROTONIX) injection 40 mg (has no administration in time range)  fentaNYL (SUBLIMAZE) injection 50 mcg (has no administration in time range)  ondansetron (ZOFRAN) injection 4 mg (has no administration in time range)    ED Course  I have reviewed the triage vital signs and the nursing notes.  Pertinent labs & imaging results that were available during my care of the patient were reviewed by me and considered in my medical decision making (see  chart for details).  Clinical Course as of Nov 06 1541  Fri Nov 07, 2019  1543 Signed out to Gravity pending labs, reassessment   [RD]    Clinical Course User Index [RD] Milagros Loll, MD   MDM Rules/Calculators/A&P                      44 year old lady past medical history of peptic ulcer disease, hiatal hernia, upper GI bleed presenting to ER with nausea, vomiting, blood streaked vomit.  On exam patient actually was fairly well-appearing, vital signs were stable.  Did note some tenderness in her epigastrium. Reported dark stools but on my exam stool brown and heme occult negative.  Labs were ordered to further assess, eval for hemoglobin level.  Patient was empirically treated with pain, antiemetic and Protonix IV.  While awaiting labs, reassessment, patient signed out to Dr. Effie Shy.  Please refer to his note for final plan and disposition.  Final Clinical Impression(s) / ED Diagnoses Final diagnoses:  Nausea and vomiting, intractability of vomiting not specified, unspecified vomiting type  Epigastric pain    Rx / DC Orders ED Discharge Orders    None       Milagros Loll, MD 11/07/19 [id number]

## 2019-11-07 NOTE — ED Notes (Signed)
Per lab, should the patient need a blood transfusion, it will take time for blood preparation as the patient has antibodies.

## 2019-11-08 LAB — TYPE AND SCREEN
ABO/RH(D): A NEG
Antibody Screen: POSITIVE

## 2020-11-13 ENCOUNTER — Encounter (HOSPITAL_COMMUNITY): Payer: Self-pay | Admitting: Emergency Medicine

## 2020-11-13 ENCOUNTER — Inpatient Hospital Stay (HOSPITAL_COMMUNITY)
Admission: EM | Admit: 2020-11-13 | Discharge: 2020-11-16 | DRG: 392 | Disposition: A | Discharge status: Home / Self Care | Payer: BC Managed Care – PPO | Attending: Student | Admitting: Student

## 2020-11-13 ENCOUNTER — Emergency Department (HOSPITAL_COMMUNITY): Payer: BC Managed Care – PPO

## 2020-11-13 ENCOUNTER — Other Ambulatory Visit: Payer: Self-pay

## 2020-11-13 DIAGNOSIS — T464X5A Adverse effect of angiotensin-converting-enzyme inhibitors, initial encounter: Secondary | ICD-10-CM | POA: Diagnosis present

## 2020-11-13 DIAGNOSIS — K259 Gastric ulcer, unspecified as acute or chronic, without hemorrhage or perforation: Secondary | ICD-10-CM | POA: Diagnosis present

## 2020-11-13 DIAGNOSIS — Z820 Family history of epilepsy and other diseases of the nervous system: Secondary | ICD-10-CM

## 2020-11-13 DIAGNOSIS — Z79899 Other long term (current) drug therapy: Secondary | ICD-10-CM

## 2020-11-13 DIAGNOSIS — Z20822 Contact with and (suspected) exposure to covid-19: Secondary | ICD-10-CM | POA: Diagnosis present

## 2020-11-13 DIAGNOSIS — R1030 Lower abdominal pain, unspecified: Secondary | ICD-10-CM

## 2020-11-13 DIAGNOSIS — K5732 Diverticulitis of large intestine without perforation or abscess without bleeding: Secondary | ICD-10-CM | POA: Diagnosis not present

## 2020-11-13 DIAGNOSIS — Z9102 Food additives allergy status: Secondary | ICD-10-CM

## 2020-11-13 DIAGNOSIS — K5792 Diverticulitis of intestine, part unspecified, without perforation or abscess without bleeding: Secondary | ICD-10-CM | POA: Diagnosis present

## 2020-11-13 DIAGNOSIS — J45901 Unspecified asthma with (acute) exacerbation: Secondary | ICD-10-CM | POA: Diagnosis present

## 2020-11-13 DIAGNOSIS — K449 Diaphragmatic hernia without obstruction or gangrene: Secondary | ICD-10-CM | POA: Diagnosis present

## 2020-11-13 DIAGNOSIS — Z888 Allergy status to other drugs, medicaments and biological substances status: Secondary | ICD-10-CM

## 2020-11-13 DIAGNOSIS — Z9851 Tubal ligation status: Secondary | ICD-10-CM

## 2020-11-13 DIAGNOSIS — K429 Umbilical hernia without obstruction or gangrene: Secondary | ICD-10-CM | POA: Diagnosis present

## 2020-11-13 DIAGNOSIS — I10 Essential (primary) hypertension: Secondary | ICD-10-CM | POA: Diagnosis present

## 2020-11-13 DIAGNOSIS — J45909 Unspecified asthma, uncomplicated: Secondary | ICD-10-CM | POA: Diagnosis present

## 2020-11-13 DIAGNOSIS — F419 Anxiety disorder, unspecified: Secondary | ICD-10-CM | POA: Diagnosis present

## 2020-11-13 DIAGNOSIS — F17290 Nicotine dependence, other tobacco product, uncomplicated: Secondary | ICD-10-CM | POA: Diagnosis present

## 2020-11-13 DIAGNOSIS — E876 Hypokalemia: Secondary | ICD-10-CM | POA: Diagnosis present

## 2020-11-13 DIAGNOSIS — Z91040 Latex allergy status: Secondary | ICD-10-CM

## 2020-11-13 DIAGNOSIS — D509 Iron deficiency anemia, unspecified: Secondary | ICD-10-CM | POA: Diagnosis present

## 2020-11-13 LAB — CBC WITH DIFFERENTIAL/PLATELET
Abs Immature Granulocytes: 0.04 10*3/uL (ref 0.00–0.07)
Basophils Absolute: 0 10*3/uL (ref 0.0–0.1)
Basophils Relative: 0 %
Eosinophils Absolute: 0.1 10*3/uL (ref 0.0–0.5)
Eosinophils Relative: 1 %
HCT: 38.1 % (ref 36.0–46.0)
Hemoglobin: 11.5 g/dL — ABNORMAL LOW (ref 12.0–15.0)
Immature Granulocytes: 0 %
Lymphocytes Relative: 18 %
Lymphs Abs: 2.4 10*3/uL (ref 0.7–4.0)
MCH: 25.5 pg — ABNORMAL LOW (ref 26.0–34.0)
MCHC: 30.2 g/dL (ref 30.0–36.0)
MCV: 84.5 fL (ref 80.0–100.0)
Monocytes Absolute: 1.2 10*3/uL — ABNORMAL HIGH (ref 0.1–1.0)
Monocytes Relative: 9 %
Neutro Abs: 9.6 10*3/uL — ABNORMAL HIGH (ref 1.7–7.7)
Neutrophils Relative %: 72 %
Platelets: 307 10*3/uL (ref 150–400)
RBC: 4.51 MIL/uL (ref 3.87–5.11)
RDW: 19.4 % — ABNORMAL HIGH (ref 11.5–15.5)
WBC: 13.3 10*3/uL — ABNORMAL HIGH (ref 4.0–10.5)
nRBC: 0 % (ref 0.0–0.2)

## 2020-11-13 LAB — LIPASE, BLOOD: Lipase: 86 U/L — ABNORMAL HIGH (ref 11–51)

## 2020-11-13 LAB — COMPREHENSIVE METABOLIC PANEL
ALT: 17 U/L (ref 0–44)
AST: 17 U/L (ref 15–41)
Albumin: 4.2 g/dL (ref 3.5–5.0)
Alkaline Phosphatase: 65 U/L (ref 38–126)
Anion gap: 10 (ref 5–15)
BUN: 11 mg/dL (ref 6–20)
CO2: 29 mmol/L (ref 22–32)
Calcium: 9.3 mg/dL (ref 8.9–10.3)
Chloride: 98 mmol/L (ref 98–111)
Creatinine, Ser: 0.7 mg/dL (ref 0.44–1.00)
GFR, Estimated: >60 mL/min (ref >60)
Glucose, Bld: 111 mg/dL — ABNORMAL HIGH (ref 70–99)
Potassium: 3.3 mmol/L — ABNORMAL LOW (ref 3.5–5.1)
Sodium: 137 mmol/L (ref 135–145)
Total Bilirubin: 0.1 mg/dL — ABNORMAL LOW (ref 0.3–1.2)
Total Protein: 8.2 g/dL — ABNORMAL HIGH (ref 6.5–8.1)

## 2020-11-13 LAB — URINALYSIS, ROUTINE W REFLEX MICROSCOPIC
Bilirubin Urine: NEGATIVE
Glucose, UA: NEGATIVE mg/dL
Hgb urine dipstick: NEGATIVE
Ketones, ur: NEGATIVE mg/dL
Leukocytes,Ua: NEGATIVE
Nitrite: NEGATIVE
Protein, ur: NEGATIVE mg/dL
Specific Gravity, Urine: 1.01 (ref 1.005–1.030)
pH: 5 (ref 5.0–8.0)

## 2020-11-13 MED ORDER — HYDROMORPHONE HCL 1 MG/ML IJ SOLN
0.5000 mg | Freq: Once | INTRAMUSCULAR | Status: AC
  Administered 2020-11-13: 0.5 mg via INTRAVENOUS
  Filled 2020-11-13: qty 1

## 2020-11-13 MED ORDER — OXYCODONE-ACETAMINOPHEN 5-325 MG PO TABS: 1.0000 | ORAL_TABLET | Freq: Four times a day (QID) | ORAL | 0 refills | Status: DC | PRN

## 2020-11-13 MED ORDER — ONDANSETRON HCL 4 MG/2ML IJ SOLN
4.0000 mg | Freq: Once | INTRAMUSCULAR | Status: AC
  Administered 2020-11-13: 4 mg via INTRAVENOUS
  Filled 2020-11-13: qty 2

## 2020-11-13 MED ORDER — METRONIDAZOLE 500 MG PO TABS
500.0000 mg | ORAL_TABLET | Freq: Three times a day (TID) | ORAL | 0 refills | Status: DC
Start: 2020-11-13 — End: 2020-11-16

## 2020-11-13 MED ORDER — CIPROFLOXACIN HCL 500 MG PO TABS
500.0000 mg | ORAL_TABLET | Freq: Two times a day (BID) | ORAL | 0 refills | Status: DC
Start: 2020-11-13 — End: 2020-11-16

## 2020-11-13 MED ORDER — METRONIDAZOLE 500 MG PO TABS
500.0000 mg | ORAL_TABLET | Freq: Once | ORAL | Status: AC
  Administered 2020-11-14: 500 mg via ORAL
  Filled 2020-11-13: qty 1

## 2020-11-13 MED ORDER — FENTANYL CITRATE (PF) 100 MCG/2ML IJ SOLN
50.0000 ug | Freq: Once | INTRAMUSCULAR | Status: AC
  Administered 2020-11-13: 50 ug via INTRAVENOUS
  Filled 2020-11-13: qty 2

## 2020-11-13 MED ORDER — CIPROFLOXACIN HCL 500 MG PO TABS
500.0000 mg | ORAL_TABLET | Freq: Once | ORAL | Status: AC
  Administered 2020-11-14: 500 mg via ORAL
  Filled 2020-11-13: qty 1

## 2020-11-13 NOTE — ED Provider Notes (Signed)
[facility]-EMERGENCY DEPT Provider Note   CSN: [id number] Arrival date & time: 11/13/20  2105     History Chief Complaint  Patient presents with  . Flank Pain    Carmen Ramos is a 45 y.o. female.  Patient with history of hysterectomy, history of anemia requiring IV iron transfusions, history of diverticulitis --presents to the emergency department for evaluation of abdominal pain.  Patient states that she felt generally poorly yesterday but today began having abdominal pain in the lower abdomen, severe at times, mainly in the left lower quadrant with some radiation to the right lower quadrant.  She has had nausea without vomiting.  Prior to arrival she had "soft and watery" stool without signs of bleeding (red or black).  No urinary symptoms.  No vaginal bleeding or discharge.  She states that she has had similar pain before but not this bad.  She takes Protonix for stomach ulcers.  EGD demonstrating this in 2021.  Onset of symptoms acute.  Course is constant.  Pain worse with movement and palpation.  Nothing makes symptoms better.        Past Medical History:  Diagnosis Date  . Anemia   . Asthma   . Bleeding ulcer   . Blood transfusion without reported diagnosis    6  . Hiatal hernia   . Hypertension     Patient Active Problem List   Diagnosis Date Noted  . Acute upper GI bleed 09/13/2019  . Upper GI bleed 09/12/2019  . HTN (hypertension) 09/12/2019  . Hiatal hernia 09/12/2019  . Asthma, chronic, unspecified asthma severity, with acute exacerbation 09/12/2019  . Acute blood loss anemia 09/12/2019  . Melena   . Iron deficiency anemia due to chronic blood loss     Past Surgical History:  Procedure Laterality Date  . ABDOMINAL HYSTERECTOMY    . BIOPSY  09/13/2019   Procedure: BIOPSY;  Surgeon: Napoleon Form, MD;  Location: [facility];  Service: Endoscopy;;  . CESAREAN SECTION    . ESOPHAGOGASTRODUODENOSCOPY (EGD) WITH PROPOFOL N/A  09/13/2019   Procedure: ESOPHAGOGASTRODUODENOSCOPY (EGD) WITH PROPOFOL;  Surgeon: Napoleon Form, MD;  Location: [facility];  Service: Endoscopy;  Laterality: N/A;  . TUBAL LIGATION       OB History   No obstetric history on file.     Family History  Problem Relation Age of Onset  . Cancer Mother   . Parkinson's disease Father   . Dementia Father     Social History   Tobacco Use  . Smoking status: Current Some Day Smoker    Types: Cigars  . Smokeless tobacco: Never Used  Vaping Use  . Vaping Use: Never used  Substance Use Topics  . Alcohol use: No  . Drug use: No    Home Medications Prior to Admission medications   Medication Sig Start Date End Date Taking? Authorizing Provider  acetaminophen (TYLENOL) 325 MG tablet Take 650 mg by mouth every 6 (six) hours as needed for moderate pain.    [provider]  carboxymethylcellulose (REFRESH PLUS) 0.5 % SOLN Place 1 drop into both eyes 3 (three) times daily as needed (dry eyes).    [provider]  cetirizine (ZYRTEC) 10 MG tablet Take 10 mg by mouth daily as needed for allergies.    [provider]  citalopram (CELEXA) 20 MG tablet Take 30 mg by mouth daily. 08/25/19   [provider]  Cyanocobalamin (VITAMIN B-12 PO) Take 1 tablet by mouth daily.  [provider]  ferrous sulfate 325 (65 FE) MG tablet Take 325 mg by mouth 3 (three) times daily with meals.    [provider]  lisinopril-hydrochlorothiazide (PRINZIDE,ZESTORETIC) 20-25 MG tablet Take 1 tablet by mouth daily.    [provider]  metoprolol succinate (TOPROL-XL) 25 MG 24 hr tablet Take 25 mg by mouth daily. 09/01/19   [provider]  pantoprazole (PROTONIX) 40 MG tablet Take 1 tablet (40 mg total) by mouth 2 (two) times daily before a meal. 09/14/19   Rai, Ripudeep K, MD  sucralfate (CARAFATE) 1 g tablet Take 1 tablet (1 g total) by mouth 4 (four) times daily -  with meals and at bedtime.  11/07/19   Mancel BaleWentz, Elliott, MD    Allergies    Ferumoxytol, Gluten meal, and Latex  Review of Systems   Review of Systems  Constitutional: Negative for fever.  HENT: Negative for rhinorrhea and sore throat.   Eyes: Negative for redness.  Respiratory: Negative for cough.   Cardiovascular: Negative for chest pain.  Gastrointestinal: Positive for abdominal pain, diarrhea and nausea. Negative for vomiting.  Genitourinary: Negative for dysuria, frequency, hematuria and urgency.  Musculoskeletal: Negative for myalgias.  Skin: Negative for rash.  Neurological: Negative for headaches.    Physical Exam Updated Vital Signs BP (!) 165/106 (BP Location: Left Arm)   Pulse (!) 115   Temp 98.7 F (37.1 C) (Oral)   Resp 18   SpO2 97%   Physical Exam Vitals and nursing note reviewed.  Constitutional:      General: She is in acute distress.     Appearance: She is well-developed.     Comments: Patient with intermittent episodes of pain.  HENT:     Head: Normocephalic and atraumatic.     Right Ear: External ear normal.     Left Ear: External ear normal.     Nose: Nose normal.  Eyes:     Conjunctiva/sclera: Conjunctivae normal.  Cardiovascular:     Rate and Rhythm: Regular rhythm. Tachycardia present.     Heart sounds: No murmur heard.   Pulmonary:     Effort: No respiratory distress.     Breath sounds: No wheezing, rhonchi or rales.  Abdominal:     Palpations: Abdomen is soft.     Tenderness: There is abdominal tenderness. There is guarding. There is no rebound.     Comments: Patient screams and yells with palpation across the lower abdomen.  No rigidity noted.  Musculoskeletal:     Cervical back: Normal range of motion and neck supple.     Right lower leg: No edema.     Left lower leg: No edema.  Skin:    General: Skin is warm and dry.     Findings: No rash.  Neurological:     General: No focal deficit present.     Mental Status: She is alert. Mental status is at baseline.      Motor: No weakness.  Psychiatric:        Mood and Affect: Mood normal.     ED Results / Procedures / Treatments   Labs (all labs ordered are listed, but only abnormal results are displayed) Labs Reviewed  CBC WITH DIFFERENTIAL/PLATELET - Abnormal; Notable for the following components:      Result Value   WBC 13.3 (*)    Hemoglobin 11.5 (*)    MCH 25.5 (*)    RDW 19.4 (*)    Neutro Abs 9.6 (*)  Monocytes Absolute 1.2 (*)    All other components within normal limits  COMPREHENSIVE METABOLIC PANEL - Abnormal; Notable for the following components:   Potassium 3.3 (*)    Glucose, Bld 111 (*)    Total Protein 8.2 (*)    Total Bilirubin 0.1 (*)    All other components within normal limits  URINALYSIS, ROUTINE W REFLEX MICROSCOPIC - Abnormal; Notable for the following components:   Color, Urine STRAW (*)    All other components within normal limits  LIPASE, BLOOD - Abnormal; Notable for the following components:   Lipase 86 (*)    All other components within normal limits    EKG None  Radiology CT ABDOMEN PELVIS WO CONTRAST  Result Date: 11/13/2020 CLINICAL DATA:  Left lower quadrant pain EXAM: CT ABDOMEN AND PELVIS WITHOUT CONTRAST TECHNIQUE: Multidetector CT imaging of the abdomen and pelvis was performed following the standard protocol without oral or IV contrast. COMPARISON:  September 12, 2019 FINDINGS: Lower chest: Lung bases are clear. There is a moderate hiatal type hernia present. Hepatobiliary: There is hepatic steatosis. No focal liver lesions are apparent on this noncontrast enhanced study. Liver measures 22.1 cm in length. Gallbladder wall is not appreciably thickened. There is no appreciable biliary duct dilatation. Pancreas: There is no pancreatic mass or inflammatory focus. Spleen: No splenic lesions are evident. Adrenals/Urinary Tract: Adrenals bilaterally appear normal. There is no evident renal mass or hydronephrosis on either side. There is no appreciable renal or  ureteral calculus on either side. Urinary bladder is midline with wall thickness within normal limits. Stomach/Bowel: There are sigmoid diverticula with several irregular diverticula. There is wall thickening in the distal sigmoid colon with soft tissue stranding consistent with diverticulitis in the distal sigmoid colon region. There is no abscess or perforation in this area. Elsewhere, there is no appreciable bowel wall thickening. There is no appreciable bowel obstruction. Terminal ileum appears unremarkable. No appendiceal region inflammation. No free air or portal venous air. Vascular/Lymphatic: There is no abdominal aortic aneurysm. No vascular lesions evident on this noncontrast enhanced study. There is no evident adenopathy in the abdomen or pelvis. Reproductive: Uterus absent.  No adnexal masses evident. Other: No ascites or abscess evident in the abdomen or pelvis. There is a focal umbilical hernia containing fat but no bowel. This hernia measures 2.8 cm from right to left dimension at its neck. The neck of the hernia measures 1.8 cm from superior to inferior dimension. No bowel containing hernia evident. Musculoskeletal: No blastic or lytic bone lesions. No intramuscular lesions evident. IMPRESSION: 1. Distal sigmoid diverticulitis with wall thickening and soft tissue stranding in this area. No abscess or perforation evident. 2. No bowel obstruction. No abscess in the abdomen or pelvis. The periappendiceal region appears unremarkable. 3.  Moderate hiatal type hernia again noted. 4.  Hepatic steatosis.  Prominent liver measuring 22.1 cm in length. 5. No renal or ureteral calculus. No hydronephrosis. Urinary bladder wall thickness normal. 6.  Umbilical hernia containing fat but no bowel. 7.  Uterus absent. Electronically Signed   By: Bretta Bang III M.D.   On: 11/13/2020 22:58    Procedures Procedures   Medications Ordered in ED Medications  metroNIDAZOLE (FLAGYL) tablet 500 mg (has no  administration in time range)  ciprofloxacin (CIPRO) tablet 500 mg (has no administration in time range)  fentaNYL (SUBLIMAZE) injection 50 mcg (50 mcg Intravenous Given 11/13/20 2212)  ondansetron (ZOFRAN) injection 4 mg (4 mg Intravenous Given 11/13/20 2211)  HYDROmorphone (DILAUDID) injection 0.5  mg (0.5 mg Intravenous Given 11/13/20 2310)    ED Course  I have reviewed the triage vital signs and the nursing notes.  Pertinent labs & imaging results that were available during my care of the patient were reviewed by me and considered in my medical decision making (see chart for details).  Patient seen and examined. Work-up initiated. Medications ordered. Will obtain non-contrast CT of abd/pelvis to evaluate for diverticulitis/perforation.   This patient was evaluated during a time of global shortage of iodinated contrast media. Based on guidance from the [employer] of Radiology, best practices, and local institutional approaches an alternative path for evaluating and managing the patient may have been employed in order to provide optimal care during this shortage. The current situation has been discussed with the patient.  Vital signs reviewed and are as follows: BP (!) 165/106 (BP Location: Left Arm)   Pulse (!) 115   Temp 98.7 F (37.1 C) (Oral)   Resp 18   SpO2 97%   11:15 PM CT reviewed.  Consistent with acute sigmoid diverticulitis.  Patient is a small fat-containing hernia, unlikely related to current symptoms.  Lipase is mildly elevated, however symptoms are not consistent with acute pancreatitis.  Patient continues to have significant pain.  0.5 mg of Dilaudid ordered and given.  Will continue attempts to control pain, administer oral antibiotics.  Signout to oncoming provider -- Muthersbaugh PA-C at shift change.     MDM Rules/Calculators/A&P                          Pt with acute diverticulitis -- continue to control symptoms. No complications noted that this point.    Final Clinical Impression(s) / ED Diagnoses Final diagnoses:  Acute diverticulitis  Lower abdominal pain    Rx / DC Orders ED Discharge Orders    None       Renne Crigler, PA-C 11/13/20 2354    Wynetta Fines, MD 11/16/20 1510

## 2020-11-13 NOTE — ED Provider Notes (Signed)
Care assumed from [facility].  Please see her full H&P.  In short,  Carmen Ramos is a 44 y.o. female presents for left lower quadrant abdominal pain and history of diverticulitis.  Per initial provider clinical exam consistent with diverticulitis, labs reassuring and CT scan confirmed diverticulitis without abscess or perforation.  Physical Exam  BP 133/74 (BP Location: Left Arm)   Pulse 90   Temp 98.7 F (37.1 C) (Oral)   Resp 20   SpO2 96%   Physical Exam Vitals and nursing note reviewed.  Constitutional:      General: She is in acute distress.     Appearance: She is well-developed.     Comments: Patient writhing in bed and screaming  HENT:     Head: Normocephalic.  Eyes:     General: No scleral icterus.    Conjunctiva/sclera: Conjunctivae normal.  Cardiovascular:     Rate and Rhythm: Normal rate.  Pulmonary:     Effort: Pulmonary effort is normal.  Musculoskeletal:        General: Normal range of motion.     Cervical back: Normal range of motion.  Skin:    General: Skin is warm and dry.  Neurological:     Mental Status: She is alert.     ED Course/Procedures     Procedures  MDM   Plan: PO trial and pain control.   1:40 AM Patient was able to tolerate p.o. however has had an increase in pain and nausea since that time.  On my reevaluation of her she is screaming and crying.  Exquisitely tender left lower quadrant of her abdomen.  She is received fentanyl and Dilaudid.  Will reduce Dilaudid and give Levsin to assist with abdominal cramping.  Patient will need to be admitted for pain control.  1:55 AM Discussed patient's case with hospitalist, Dr. Toniann Fail.  I have recommended admission and patient (and family if present) agree with this plan. Admitting physician will place admission orders.    Acute diverticulitis  Lower abdominal pain     Milta Deiters 11/14/20 0155    Nira Conn, MD 11/15/20 2117

## 2020-11-13 NOTE — ED Notes (Signed)
Patient transported to CT 

## 2020-11-13 NOTE — ED Triage Notes (Signed)
Pt reports L flank and LLQ abdominal pain that worsened today. States that it sits at a 7, but flares to a 10. Hx of hysterectomy 4 years ago. Denies dysuria or diarrhea. Hx of diverticulitis.

## 2020-11-14 ENCOUNTER — Encounter (HOSPITAL_COMMUNITY): Payer: Self-pay | Admitting: Internal Medicine

## 2020-11-14 ENCOUNTER — Other Ambulatory Visit: Payer: Self-pay

## 2020-11-14 DIAGNOSIS — F419 Anxiety disorder, unspecified: Secondary | ICD-10-CM | POA: Diagnosis present

## 2020-11-14 DIAGNOSIS — T464X5A Adverse effect of angiotensin-converting-enzyme inhibitors, initial encounter: Secondary | ICD-10-CM | POA: Diagnosis present

## 2020-11-14 DIAGNOSIS — D649 Anemia, unspecified: Secondary | ICD-10-CM

## 2020-11-14 DIAGNOSIS — K5792 Diverticulitis of intestine, part unspecified, without perforation or abscess without bleeding: Secondary | ICD-10-CM | POA: Diagnosis present

## 2020-11-14 DIAGNOSIS — R1030 Lower abdominal pain, unspecified: Secondary | ICD-10-CM | POA: Diagnosis not present

## 2020-11-14 DIAGNOSIS — F17290 Nicotine dependence, other tobacco product, uncomplicated: Secondary | ICD-10-CM | POA: Diagnosis present

## 2020-11-14 DIAGNOSIS — Z9102 Food additives allergy status: Secondary | ICD-10-CM | POA: Diagnosis not present

## 2020-11-14 DIAGNOSIS — E876 Hypokalemia: Secondary | ICD-10-CM

## 2020-11-14 DIAGNOSIS — Z9851 Tubal ligation status: Secondary | ICD-10-CM | POA: Diagnosis not present

## 2020-11-14 DIAGNOSIS — Z888 Allergy status to other drugs, medicaments and biological substances status: Secondary | ICD-10-CM | POA: Diagnosis not present

## 2020-11-14 DIAGNOSIS — I10 Essential (primary) hypertension: Secondary | ICD-10-CM | POA: Diagnosis present

## 2020-11-14 DIAGNOSIS — K449 Diaphragmatic hernia without obstruction or gangrene: Secondary | ICD-10-CM | POA: Diagnosis present

## 2020-11-14 DIAGNOSIS — K5732 Diverticulitis of large intestine without perforation or abscess without bleeding: Secondary | ICD-10-CM | POA: Diagnosis present

## 2020-11-14 DIAGNOSIS — Z20822 Contact with and (suspected) exposure to covid-19: Secondary | ICD-10-CM | POA: Diagnosis present

## 2020-11-14 DIAGNOSIS — Z91040 Latex allergy status: Secondary | ICD-10-CM | POA: Diagnosis not present

## 2020-11-14 DIAGNOSIS — J45901 Unspecified asthma with (acute) exacerbation: Secondary | ICD-10-CM

## 2020-11-14 DIAGNOSIS — Z820 Family history of epilepsy and other diseases of the nervous system: Secondary | ICD-10-CM | POA: Diagnosis not present

## 2020-11-14 DIAGNOSIS — Z79899 Other long term (current) drug therapy: Secondary | ICD-10-CM | POA: Diagnosis not present

## 2020-11-14 DIAGNOSIS — K279 Peptic ulcer, site unspecified, unspecified as acute or chronic, without hemorrhage or perforation: Secondary | ICD-10-CM | POA: Diagnosis not present

## 2020-11-14 DIAGNOSIS — J45909 Unspecified asthma, uncomplicated: Secondary | ICD-10-CM | POA: Diagnosis present

## 2020-11-14 DIAGNOSIS — K429 Umbilical hernia without obstruction or gangrene: Secondary | ICD-10-CM | POA: Diagnosis present

## 2020-11-14 DIAGNOSIS — D509 Iron deficiency anemia, unspecified: Secondary | ICD-10-CM | POA: Diagnosis present

## 2020-11-14 DIAGNOSIS — K259 Gastric ulcer, unspecified as acute or chronic, without hemorrhage or perforation: Secondary | ICD-10-CM | POA: Diagnosis present

## 2020-11-14 LAB — TROPONIN I (HIGH SENSITIVITY): Troponin I (High Sensitivity): 2 ng/L (ref <18)

## 2020-11-14 LAB — CBC WITH DIFFERENTIAL/PLATELET
Abs Immature Granulocytes: 0.04 10*3/uL (ref 0.00–0.07)
Basophils Absolute: 0 10*3/uL (ref 0.0–0.1)
Basophils Relative: 0 %
Eosinophils Absolute: 0.1 10*3/uL (ref 0.0–0.5)
Eosinophils Relative: 1 %
HCT: 33 % — ABNORMAL LOW (ref 36.0–46.0)
Hemoglobin: 9.9 g/dL — ABNORMAL LOW (ref 12.0–15.0)
Immature Granulocytes: 0 %
Lymphocytes Relative: 24 %
Lymphs Abs: 2.4 10*3/uL (ref 0.7–4.0)
MCH: 25.8 pg — ABNORMAL LOW (ref 26.0–34.0)
MCHC: 30 g/dL (ref 30.0–36.0)
MCV: 85.9 fL (ref 80.0–100.0)
Monocytes Absolute: 0.9 10*3/uL (ref 0.1–1.0)
Monocytes Relative: 9 %
Neutro Abs: 6.5 10*3/uL (ref 1.7–7.7)
Neutrophils Relative %: 66 %
Platelets: 244 10*3/uL (ref 150–400)
RBC: 3.84 MIL/uL — ABNORMAL LOW (ref 3.87–5.11)
RDW: 19.3 % — ABNORMAL HIGH (ref 11.5–15.5)
WBC: 10 10*3/uL (ref 4.0–10.5)
nRBC: 0 % (ref 0.0–0.2)

## 2020-11-14 LAB — COMPREHENSIVE METABOLIC PANEL
ALT: 15 U/L (ref 0–44)
AST: 14 U/L — ABNORMAL LOW (ref 15–41)
Albumin: 3.4 g/dL — ABNORMAL LOW (ref 3.5–5.0)
Alkaline Phosphatase: 54 U/L (ref 38–126)
Anion gap: 8 (ref 5–15)
BUN: 10 mg/dL (ref 6–20)
CO2: 27 mmol/L (ref 22–32)
Calcium: 8.3 mg/dL — ABNORMAL LOW (ref 8.9–10.3)
Chloride: 101 mmol/L (ref 98–111)
Creatinine, Ser: 0.52 mg/dL (ref 0.44–1.00)
GFR, Estimated: >60 mL/min (ref >60)
Glucose, Bld: 105 mg/dL — ABNORMAL HIGH (ref 70–99)
Potassium: 2.9 mmol/L — ABNORMAL LOW (ref 3.5–5.1)
Sodium: 136 mmol/L (ref 135–145)
Total Bilirubin: 0.2 mg/dL — ABNORMAL LOW (ref 0.3–1.2)
Total Protein: 6.7 g/dL (ref 6.5–8.1)

## 2020-11-14 LAB — HIV ANTIBODY (ROUTINE TESTING W REFLEX): HIV Screen 4th Generation wRfx: NONREACTIVE

## 2020-11-14 LAB — CBG MONITORING, ED: Glucose-Capillary: 100 mg/dL — ABNORMAL HIGH (ref 70–99)

## 2020-11-14 LAB — GLUCOSE, CAPILLARY
Glucose-Capillary: 100 mg/dL — ABNORMAL HIGH (ref 70–99)
Glucose-Capillary: 118 mg/dL — ABNORMAL HIGH (ref 70–99)
Glucose-Capillary: 146 mg/dL — ABNORMAL HIGH (ref 70–99)

## 2020-11-14 LAB — MAGNESIUM: Magnesium: 1.8 mg/dL (ref 1.7–2.4)

## 2020-11-14 LAB — SARS CORONAVIRUS 2 (TAT 6-24 HRS): SARS Coronavirus 2: NEGATIVE

## 2020-11-14 LAB — LIPASE, BLOOD: Lipase: 26 U/L (ref 11–51)

## 2020-11-14 MED ORDER — HYOSCYAMINE SULFATE 0.125 MG PO TBDP: 0.1250 mg | ORAL_TABLET | Freq: Once | ORAL | Status: DC

## 2020-11-14 MED ORDER — HYDROMORPHONE HCL 1 MG/ML IJ SOLN
1.0000 mg | INTRAMUSCULAR | Status: DC | PRN
  Administered 2020-11-14 – 2020-11-15 (×10): 1 mg via INTRAVENOUS
  Filled 2020-11-14 (×10): qty 1

## 2020-11-14 MED ORDER — METOPROLOL SUCCINATE ER 25 MG PO TB24
25.0000 mg | ORAL_TABLET | Freq: Every day | ORAL | Status: DC
  Administered 2020-11-14 – 2020-11-15 (×2): 25 mg via ORAL
  Filled 2020-11-14 (×3): qty 1

## 2020-11-14 MED ORDER — LACTATED RINGERS IV BOLUS
1000.0000 mL | Freq: Once | INTRAVENOUS | Status: AC
  Administered 2020-11-14: 1000 mL via INTRAVENOUS

## 2020-11-14 MED ORDER — HYDROMORPHONE HCL 1 MG/ML IJ SOLN
1.0000 mg | Freq: Once | INTRAMUSCULAR | Status: AC
  Administered 2020-11-14: 1 mg via INTRAVENOUS
  Filled 2020-11-14: qty 1

## 2020-11-14 MED ORDER — LISINOPRIL 20 MG PO TABS
20.0000 mg | ORAL_TABLET | Freq: Every day | ORAL | Status: DC
  Administered 2020-11-14 – 2020-11-15 (×2): 20 mg via ORAL
  Filled 2020-11-14 (×2): qty 1

## 2020-11-14 MED ORDER — METRONIDAZOLE 500 MG/100ML IV SOLN
500.0000 mg | Freq: Three times a day (TID) | INTRAVENOUS | Status: DC
  Administered 2020-11-14: 500 mg via INTRAVENOUS
  Filled 2020-11-14: qty 100

## 2020-11-14 MED ORDER — ONDANSETRON HCL 4 MG/2ML IJ SOLN
INTRAMUSCULAR | Status: AC
  Administered 2020-11-14: 4 mg via INTRAVENOUS
  Filled 2020-11-14: qty 2

## 2020-11-14 MED ORDER — LISINOPRIL-HYDROCHLOROTHIAZIDE 20-25 MG PO TABS: 1.0000 | ORAL_TABLET | Freq: Every day | ORAL | Status: DC

## 2020-11-14 MED ORDER — ONDANSETRON HCL 4 MG/2ML IJ SOLN
4.0000 mg | Freq: Once | INTRAMUSCULAR | Status: AC
  Administered 2020-11-14: 4 mg via INTRAVENOUS
  Filled 2020-11-14: qty 2

## 2020-11-14 MED ORDER — LABETALOL HCL 5 MG/ML IV SOLN
10.0000 mg | INTRAVENOUS | Status: DC | PRN
  Filled 2020-11-14: qty 4

## 2020-11-14 MED ORDER — POLYVINYL ALCOHOL 1.4 % OP SOLN
1.0000 [drp] | Freq: Three times a day (TID) | OPHTHALMIC | Status: DC | PRN
  Filled 2020-11-14: qty 15

## 2020-11-14 MED ORDER — ONDANSETRON HCL 4 MG/2ML IJ SOLN
4.0000 mg | Freq: Four times a day (QID) | INTRAMUSCULAR | Status: DC | PRN
  Administered 2020-11-14 – 2020-11-15 (×4): 4 mg via INTRAVENOUS
  Filled 2020-11-14 (×4): qty 2

## 2020-11-14 MED ORDER — CIPROFLOXACIN IN D5W 400 MG/200ML IV SOLN: 400.0000 mg | Freq: Two times a day (BID) | INTRAVENOUS | Status: DC

## 2020-11-14 MED ORDER — HYOSCYAMINE SULFATE 0.125 MG SL SUBL
0.1250 mg | SUBLINGUAL_TABLET | Freq: Once | SUBLINGUAL | Status: AC
  Administered 2020-11-14: 0.125 mg via SUBLINGUAL
  Filled 2020-11-14: qty 1

## 2020-11-14 MED ORDER — HYDROCHLOROTHIAZIDE 25 MG PO TABS
25.0000 mg | ORAL_TABLET | Freq: Every day | ORAL | Status: DC
  Filled 2020-11-14: qty 1

## 2020-11-14 MED ORDER — LACTATED RINGERS IV SOLN: INTRAVENOUS | Status: DC

## 2020-11-14 MED ORDER — POTASSIUM CHLORIDE CRYS ER 20 MEQ PO TBCR
40.0000 meq | EXTENDED_RELEASE_TABLET | ORAL | Status: AC
  Administered 2020-11-14 (×3): 40 meq via ORAL
  Filled 2020-11-14 (×3): qty 2

## 2020-11-14 MED ORDER — PANTOPRAZOLE SODIUM 40 MG IV SOLR
40.0000 mg | Freq: Two times a day (BID) | INTRAVENOUS | Status: DC
  Administered 2020-11-14 – 2020-11-15 (×3): 40 mg via INTRAVENOUS
  Filled 2020-11-14 (×3): qty 40

## 2020-11-14 MED ORDER — CITALOPRAM HYDROBROMIDE 20 MG PO TABS
30.0000 mg | ORAL_TABLET | Freq: Every day | ORAL | Status: DC
  Administered 2020-11-14 – 2020-11-15 (×2): 30 mg via ORAL
  Filled 2020-11-14: qty 3
  Filled 2020-11-14: qty 2
  Filled 2020-11-14: qty 3

## 2020-11-14 MED ORDER — PANTOPRAZOLE SODIUM 40 MG IV SOLR
40.0000 mg | INTRAVENOUS | Status: DC
  Administered 2020-11-14: 40 mg via INTRAVENOUS
  Filled 2020-11-14: qty 40

## 2020-11-14 MED ORDER — HEPARIN SODIUM (PORCINE) 5000 UNIT/ML IJ SOLN
5000.0000 [IU] | Freq: Three times a day (TID) | INTRAMUSCULAR | Status: DC
  Administered 2020-11-14 – 2020-11-16 (×7): 5000 [IU] via SUBCUTANEOUS
  Filled 2020-11-14 (×7): qty 1

## 2020-11-14 MED ORDER — SODIUM CHLORIDE 0.9 % IV BOLUS
500.0000 mL | Freq: Once | INTRAVENOUS | Status: AC
  Administered 2020-11-14: 500 mL via INTRAVENOUS

## 2020-11-14 MED ORDER — SUCRALFATE 1 G PO TABS
1.0000 g | ORAL_TABLET | Freq: Three times a day (TID) | ORAL | Status: DC
  Administered 2020-11-14 – 2020-11-16 (×9): 1 g via ORAL
  Filled 2020-11-14 (×10): qty 1

## 2020-11-14 MED ORDER — SODIUM CHLORIDE 0.9 % IV SOLN
3.0000 g | Freq: Four times a day (QID) | INTRAVENOUS | Status: DC
  Administered 2020-11-14 – 2020-11-16 (×9): 3 g via INTRAVENOUS
  Filled 2020-11-14 (×3): qty 3
  Filled 2020-11-14: qty 8
  Filled 2020-11-14 (×3): qty 3
  Filled 2020-11-14: qty 8
  Filled 2020-11-14 (×2): qty 3

## 2020-11-14 NOTE — ED Notes (Signed)
Patient provided with ice per request.

## 2020-11-14 NOTE — Progress Notes (Signed)
Pharmacy Antibiotic Note  Carmen Ramos is a 45 y.o. female admitted on 11/13/2020 with acute diverticulitis.  Pharmacy has been consulted for Cipro dosing. Flagyl per MD.   Plan: Cipro 400mg  IV q12h Need for further dosage adjustment appears unlikely at present.    Will sign off at this time.  Please reconsult if a change in clinical status warrants re-evaluation of dosage.     Temp (24hrs), Avg:98.2 F (36.8 C), Min:97.7 F (36.5 C), Max:98.7 F (37.1 C)  Recent Labs  Lab 11/13/20 2217  WBC 13.3*  CREATININE 0.70    CrCl cannot be calculated (Unknown ideal weight.).    Allergies  Allergen Reactions  . Ferumoxytol Itching  . Gluten Meal     Arthritis, IBS  . Latex Rash   Thank you for allowing pharmacy to be a part of this patient's care.  2218, PharmD 11/14/2020 4:36 AM

## 2020-11-14 NOTE — ED Notes (Signed)
Pt. CBG 100, RN, Huntley Dec made aware.

## 2020-11-14 NOTE — H&P (Addendum)
History and Physical    Carmen Ramos ZJQ:[id number] DOB: December 18, 1975 DOA: 11/13/2020  PCP: Burnis Medin, PA-C  Patient coming from: Home.  Chief Complaint: Abdominal pain.  HPI: Carmen Ramos is a 45 y.o. female with history of stenting of the disease, hypertension, asthma presented to the ER with complaints of abdominal pain.  Pain is mostly in the left lower quadrant for the last 2 days.  No nausea vomiting or diarrhea.  Denies any fever chills.  ED Course: In the ER on exam patient has left lower quadrant tenderness and CT abdomen pelvis shows acute diverticulitis.  Labs are significant for mildly elevated lipase..  While in the ER patient had brief episode of chest pressure which resolved without any intervention.  COVID test negative.  Patient admitted for acute diverticulitis.  Review of Systems: As per HPI, rest all negative.   Past Medical History:  Diagnosis Date  . Anemia   . Asthma   . Bleeding ulcer   . Blood transfusion without reported diagnosis    6  . Hiatal hernia   . Hypertension     Past Surgical History:  Procedure Laterality Date  . ABDOMINAL HYSTERECTOMY    . BIOPSY  09/13/2019   Procedure: BIOPSY;  Surgeon: Napoleon Form, MD;  Location: [facility];  Service: Endoscopy;;  . CESAREAN SECTION    . ESOPHAGOGASTRODUODENOSCOPY (EGD) WITH PROPOFOL N/A 09/13/2019   Procedure: ESOPHAGOGASTRODUODENOSCOPY (EGD) WITH PROPOFOL;  Surgeon: Napoleon Form, MD;  Location: [facility];  Service: Endoscopy;  Laterality: N/A;  . TUBAL LIGATION       reports that she has been smoking cigars. She has never used smokeless tobacco. She reports that she does not drink alcohol and does not use drugs.  Allergies  Allergen Reactions  . Ferumoxytol Itching  . Gluten Meal     Arthritis, IBS  . Latex Rash    Family History  Problem Relation Age of Onset  . Cancer Mother   . Parkinson's disease Father   . Dementia Father     Prior to  Admission medications   Medication Sig Start Date End Date Taking? Authorizing Provider  ciprofloxacin (CIPRO) 500 MG tablet Take 1 tablet (500 mg total) by mouth 2 (two) times daily. 11/13/20  Yes Renne Crigler, PA-C  metroNIDAZOLE (FLAGYL) 500 MG tablet Take 1 tablet (500 mg total) by mouth 3 (three) times daily. 11/13/20  Yes Renne Crigler, PA-C  oxyCODONE-acetaminophen (PERCOCET/ROXICET) 5-325 MG tablet Take 1 tablet by mouth every 6 (six) hours as needed for severe pain. 11/13/20  Yes Renne Crigler, PA-C  acetaminophen (TYLENOL) 325 MG tablet Take 650 mg by mouth every 6 (six) hours as needed for moderate pain.    [provider]  carboxymethylcellulose (REFRESH PLUS) 0.5 % SOLN Place 1 drop into both eyes 3 (three) times daily as needed (dry eyes).    [provider]  cetirizine (ZYRTEC) 10 MG tablet Take 10 mg by mouth daily as needed for allergies.    [provider]  citalopram (CELEXA) 20 MG tablet Take 30 mg by mouth daily. 08/25/19   [provider]  Cyanocobalamin (VITAMIN B-12 PO) Take 1 tablet by mouth daily.    [provider]  ferrous sulfate 325 (65 FE) MG tablet Take 325 mg by mouth 3 (three) times daily with meals.    [provider]  lisinopril-hydrochlorothiazide (PRINZIDE,ZESTORETIC) 20-25 MG tablet Take 1 tablet by mouth daily.    [provider]  metoprolol  succinate (TOPROL-XL) 25 MG 24 hr tablet Take 25 mg by mouth daily. 09/01/19   [provider]  pantoprazole (PROTONIX) 40 MG tablet Take 1 tablet (40 mg total) by mouth 2 (two) times daily before a meal. 09/14/19   Rai, Ripudeep K, MD  sucralfate (CARAFATE) 1 g tablet Take 1 tablet (1 g total) by mouth 4 (four) times daily -  with meals and at bedtime. 11/07/19   Mancel Bale, MD    Physical Exam: Constitutional: Moderately built and nourished. Vitals:   11/14/20 0245 11/14/20 0300 11/14/20 0315 11/14/20 0330  BP:   139/62 (!) 143/71  Pulse: 80 73  72 79  Resp:    18  Temp:    97.7 F (36.5 C)  TempSrc:    Oral  SpO2: 95% 91% 97% 98%   Eyes: Anicteric no pallor. ENMT: No discharge from the ears eyes nose or mouth. Neck: No mass felt.  No neck rigidity. Respiratory: No rhonchi or crepitations. Cardiovascular: S1-S2 heard. Abdomen: Left lower quadrant tenderness.  No guarding no rigidity. Musculoskeletal: No edema. Skin: No rash. Neurologic: Alert awake oriented to time place and person.  Moves all extremities. Psychiatric: Appears normal.  Normal affect.   Labs on Admission: I have personally reviewed following labs and imaging studies  CBC: Recent Labs  Lab 11/13/20 2217  WBC 13.3*  NEUTROABS 9.6*  HGB 11.5*  HCT 38.1  MCV 84.5  PLT 307   Basic Metabolic Panel: Recent Labs  Lab 11/13/20 2217  NA 137  K 3.3*  CL 98  CO2 29  GLUCOSE 111*  BUN 11  CREATININE 0.70  CALCIUM 9.3   GFR: CrCl cannot be calculated (Unknown ideal weight.). Liver Function Tests: Recent Labs  Lab 11/13/20 2217  AST 17  ALT 17  ALKPHOS 65  BILITOT 0.1*  PROT 8.2*  ALBUMIN 4.2   Recent Labs  Lab 11/13/20 2217  LIPASE 86*   No results for input(s): AMMONIA in the last 168 hours. Coagulation Profile: No results for input(s): INR, PROTIME in the last 168 hours. Cardiac Enzymes: No results for input(s): CKTOTAL, CKMB, CKMBINDEX, TROPONINI in the last 168 hours. BNP (last 3 results) No results for input(s): PROBNP in the last 8760 hours. HbA1C: No results for input(s): HGBA1C in the last 72 hours. CBG: No results for input(s): GLUCAP in the last 168 hours. Lipid Profile: No results for input(s): CHOL, HDL, LDLCALC, TRIG, CHOLHDL, LDLDIRECT in the last 72 hours. Thyroid Function Tests: No results for input(s): TSH, T4TOTAL, FREET4, T3FREE, THYROIDAB in the last 72 hours. Anemia Panel: No results for input(s): VITAMINB12, FOLATE, FERRITIN, TIBC, IRON, RETICCTPCT in the last 72 hours. Urine analysis:    Component  Value Date/Time   COLORURINE STRAW (A) 11/13/2020 2204   APPEARANCEUR CLEAR 11/13/2020 2204   LABSPEC 1.010 11/13/2020 2204   PHURINE 5.0 11/13/2020 2204   GLUCOSEU NEGATIVE 11/13/2020 2204   HGBUR NEGATIVE 11/13/2020 2204   BILIRUBINUR NEGATIVE 11/13/2020 2204   KETONESUR NEGATIVE 11/13/2020 2204   PROTEINUR NEGATIVE 11/13/2020 2204   NITRITE NEGATIVE 11/13/2020 2204   LEUKOCYTESUR NEGATIVE 11/13/2020 2204   Sepsis Labs: @LABRCNTIP (procalcitonin:4,lacticidven:4) )No results found for this or any previous visit (from the past 240 hour(s)).   Radiological Exams on Admission: CT ABDOMEN PELVIS WO CONTRAST  Result Date: 11/13/2020 CLINICAL DATA:  Left lower quadrant pain EXAM: CT ABDOMEN AND PELVIS WITHOUT CONTRAST TECHNIQUE: Multidetector CT imaging of the abdomen and pelvis was performed following the standard protocol without oral or IV  contrast. COMPARISON:  September 12, 2019 FINDINGS: Lower chest: Lung bases are clear. There is a moderate hiatal type hernia present. Hepatobiliary: There is hepatic steatosis. No focal liver lesions are apparent on this noncontrast enhanced study. Liver measures 22.1 cm in length. Gallbladder wall is not appreciably thickened. There is no appreciable biliary duct dilatation. Pancreas: There is no pancreatic mass or inflammatory focus. Spleen: No splenic lesions are evident. Adrenals/Urinary Tract: Adrenals bilaterally appear normal. There is no evident renal mass or hydronephrosis on either side. There is no appreciable renal or ureteral calculus on either side. Urinary bladder is midline with wall thickness within normal limits. Stomach/Bowel: There are sigmoid diverticula with several irregular diverticula. There is wall thickening in the distal sigmoid colon with soft tissue stranding consistent with diverticulitis in the distal sigmoid colon region. There is no abscess or perforation in this area. Elsewhere, there is no appreciable bowel wall thickening. There  is no appreciable bowel obstruction. Terminal ileum appears unremarkable. No appendiceal region inflammation. No free air or portal venous air. Vascular/Lymphatic: There is no abdominal aortic aneurysm. No vascular lesions evident on this noncontrast enhanced study. There is no evident adenopathy in the abdomen or pelvis. Reproductive: Uterus absent.  No adnexal masses evident. Other: No ascites or abscess evident in the abdomen or pelvis. There is a focal umbilical hernia containing fat but no bowel. This hernia measures 2.8 cm from right to left dimension at its neck. The neck of the hernia measures 1.8 cm from superior to inferior dimension. No bowel containing hernia evident. Musculoskeletal: No blastic or lytic bone lesions. No intramuscular lesions evident. IMPRESSION: 1. Distal sigmoid diverticulitis with wall thickening and soft tissue stranding in this area. No abscess or perforation evident. 2. No bowel obstruction. No abscess in the abdomen or pelvis. The periappendiceal region appears unremarkable. 3.  Moderate hiatal type hernia again noted. 4.  Hepatic steatosis.  Prominent liver measuring 22.1 cm in length. 5. No renal or ureteral calculus. No hydronephrosis. Urinary bladder wall thickness normal. 6.  Umbilical hernia containing fat but no bowel. 7.  Uterus absent. Electronically Signed   By: Bretta Bang III M.D.   On: 11/13/2020 22:58     Assessment/Plan Principal Problem:   Acute diverticulitis Active Problems:   HTN (hypertension)   Asthma, chronic, unspecified asthma severity, with acute exacerbation    1. Acute sigmoid diverticulitis -we will keep patient n.p.o. since patient has significant pain.  IV fluids pain relief medications and Cipro and Flagyl.  We will try to advance diet as tolerated. 2. Hypertension since patient is n.p.o. we will keep patient n.p.o. and IV labetalol. 3. History of peptic ulcer disease and previously GI bleed chronic. 4. History of asthma not  actively wheezing. 5. Recent visit with chest pressure in the ER presently resolved.  Will check EKG and troponin. 6. Elevated lipase but symptoms are more consistent with diverticulitis.   DVT prophylaxis: Heparin. Code Status: Full code. Family Communication: Discussed with patient. Disposition Plan: Home. Consults called: None. Admission status: Observation.   Eduard Clos MD [facility] Pager [phone].  If 7PM-7AM, please contact night-coverage [email or web address] Password [facility]  11/14/2020, 4:21 AM

## 2020-11-14 NOTE — Progress Notes (Signed)
PROGRESS NOTE  Carmen Ramos:[id number] DOB: 1976/06/19   PCP: Burnis Medin, PA-C  Patient is from: Home  DOA: 11/13/2020 LOS: 0  Chief complaints: Abdominal pain  Brief Narrative / Interim history: 45 year old F with PMH of diverticulitis, HTN, asthma, anxiety and iron deficiency anemia presenting with LLQ abdominal pain for 2 days and admitted for acute uncomplicated diverticulitis without abscess or perforation.  Started on IV Cipro and Flagyl.   Subjective: Seen and examined earlier this morning.  No major events overnight of this morning.  She continues to endorse severe pain that she rates as 9/10 over LLQ and improved to 5/10 after IV pain medication.  She also reports nausea with IV antibiotics.  She denies emesis.  She thinks she had subjective fever before arrival.  She denies chest pain or dyspnea.  She denies melena or hematochezia.  Objective: Vitals:   11/14/20 0600 11/14/20 0700 11/14/20 0811 11/14/20 0907  BP: (!) 179/84 (!) 179/90 (!) 169/88 (!) 141/68  Pulse: 63 (!) 58 63 (!) 58  Resp: 14 12 16 18   Temp: 97.7 F (36.5 C)  97.6 F (36.4 C) 97.8 F (36.6 C)  TempSrc: Oral  Oral   SpO2: 98% 98% 100% 94%    Intake/Output Summary (Last 24 hours) at 11/14/2020 1014 Last data filed at 11/14/2020 0739 Gross per 24 hour  Intake 37.34 ml  Output --  Net 37.34 ml   There were no vitals filed for this visit.  Examination:  GENERAL: No apparent distress.  Nontoxic. HEENT: MMM.  Vision and hearing grossly intact.  NECK: Supple.  No apparent JVD.  RESP: On RA.  No IWOB.  Fair aeration bilaterally. CVS:  RRR. Heart sounds normal.  ABD/GI/GU: BS+. Abd soft.  LLQ tenderness.  No rebound or guarding. MSK/EXT:  Moves extremities. No apparent deformity. No edema.  SKIN: no apparent skin lesion or wound NEURO: Awake, alert and oriented appropriately.  No apparent focal neuro deficit. PSYCH: Calm. Normal affect.   Procedures:  None  Microbiology  summarized: COVID-19 PCR nonreactive.  Assessment & Plan: Acute sigmoid diverticulitis without perforation or abscess-previous history of diverticulitis about 3 years ago.  Continues to endorse severe pain requiring IV pain medications.  Also nausea with IV Flagyl. -Change antibiotics to IV Unasyn -Continue IV Flagyl as needed for pain -Start clear liquid diet.   -Continue IV fluid -As needed antiemetics as needed for nausea and emesis.  Hypokalemia: K2.9.  Could be due to lisinopril and HCTZ. -K-Dur 40 mEq x3 -Hold HCTZ -Check Mg  Elevated lipase: No clinical or radiologic evidence of pancreatitis. -Recheck  History of peptic ulcer disease -Continue IV Protonix and Carafate  Essential hypertension: SBP in the 170s but improved to 140s.  Partly due to pain. -Pain control as above -Resume home lisinopril and metoprolol -As needed labetalol -Hold HCTZ  History of asthma?  Does not seem to medication.  Stable.  Anxiety -Continue home Celexa.  History of iron deficiency anemia: Slight drop in Hgb likely dilutional. Recent Labs    11/13/20 2217 11/14/20 0450  HGB 11.5* 9.9*  -Check anemia panel -Recheck CBC in the morning  There is no height or weight on file to calculate BMI.         DVT prophylaxis:  heparin injection 5,000 Units Start: 11/14/20 0600  Code Status: Full code Family Communication: Updated patient's sister at bedside. Level of care: Med-Surg Status is: Observation  The patient will require care spanning > 2 midnights and should  be moved to inpatient because: Persistent severe electrolyte disturbances, Ongoing active pain requiring inpatient pain management, IV treatments appropriate due to intensity of illness or inability to take PO and Inpatient level of care appropriate due to severity of illness  Dispo: The patient is from: Home              Anticipated d/c is to: Home              Patient currently is not medically stable to d/c.   Difficult  to place patient No       Consultants:  None   Sch Meds:  Scheduled Meds: . citalopram  30 mg Oral Daily  . heparin  5,000 Units Subcutaneous Q8H  . lisinopril  20 mg Oral Daily   And  . hydrochlorothiazide  25 mg Oral Daily  . metoprolol succinate  25 mg Oral Daily  . pantoprazole (PROTONIX) IV  40 mg Intravenous Q24H  . sucralfate  1 g Oral TID WC & HS   Continuous Infusions: . ampicillin-sulbactam (UNASYN) IV 3 g (11/14/20 0753)  . lactated ringers 125 mL/hr at 11/14/20 0248   PRN Meds:.HYDROmorphone (DILAUDID) injection, labetalol, ondansetron (ZOFRAN) IV, polyvinyl alcohol  Antimicrobials: Anti-infectives (From admission, onward)   Start     Dose/Rate Route Frequency Ordered Stop   11/14/20 1200  ciprofloxacin (CIPRO) IVPB 400 mg  Status:  Discontinued        400 mg 200 mL/hr over 60 Minutes Intravenous Every 12 hours 11/14/20 0436 11/14/20 0739   11/14/20 0830  Ampicillin-Sulbactam (UNASYN) 3 g in sodium chloride 0.9 % 100 mL IVPB        3 g 200 mL/hr over 30 Minutes Intravenous Every 6 hours 11/14/20 0739     11/14/20 0800  metroNIDAZOLE (FLAGYL) IVPB 500 mg  Status:  Discontinued        500 mg 100 mL/hr over 60 Minutes Intravenous Every 8 hours 11/14/20 0419 11/14/20 0739   11/13/20 2330  metroNIDAZOLE (FLAGYL) tablet 500 mg        500 mg Oral  Once 11/13/20 2315 11/14/20 0004   11/13/20 2330  ciprofloxacin (CIPRO) tablet 500 mg        500 mg Oral  Once 11/13/20 2315 11/14/20 0004   11/13/20 0000  metroNIDAZOLE (FLAGYL) 500 MG tablet        500 mg Oral 3 times daily 11/13/20 2319     11/13/20 0000  ciprofloxacin (CIPRO) 500 MG tablet        500 mg Oral 2 times daily 11/13/20 2319         I have personally reviewed the following labs and images: CBC: Recent Labs  Lab 11/13/20 2217 11/14/20 0450  WBC 13.3* 10.0  NEUTROABS 9.6* 6.5  HGB 11.5* 9.9*  HCT 38.1 33.0*  MCV 84.5 85.9  PLT 307 244   BMP &GFR Recent Labs  Lab 11/13/20 2217  11/14/20 0450  NA 137 136  K 3.3* 2.9*  CL 98 101  CO2 29 27  GLUCOSE 111* 105*  BUN 11 10  CREATININE 0.70 0.52  CALCIUM 9.3 8.3*   CrCl cannot be calculated (Unknown ideal weight.). Liver & Pancreas: Recent Labs  Lab 11/13/20 2217 11/14/20 0450  AST 17 14*  ALT 17 15  ALKPHOS 65 54  BILITOT 0.1* 0.2*  PROT 8.2* 6.7  ALBUMIN 4.2 3.4*   Recent Labs  Lab 11/13/20 2217  LIPASE 86*   No results for input(s): AMMONIA in the last  168 hours. Diabetic: No results for input(s): HGBA1C in the last 72 hours. Recent Labs  Lab 11/14/20 0612  GLUCAP 100*   Cardiac Enzymes: No results for input(s): CKTOTAL, CKMB, CKMBINDEX, TROPONINI in the last 168 hours. No results for input(s): PROBNP in the last 8760 hours. Coagulation Profile: No results for input(s): INR, PROTIME in the last 168 hours. Thyroid Function Tests: No results for input(s): TSH, T4TOTAL, FREET4, T3FREE, THYROIDAB in the last 72 hours. Lipid Profile: No results for input(s): CHOL, HDL, LDLCALC, TRIG, CHOLHDL, LDLDIRECT in the last 72 hours. Anemia Panel: No results for input(s): VITAMINB12, FOLATE, FERRITIN, TIBC, IRON, RETICCTPCT in the last 72 hours. Urine analysis:    Component Value Date/Time   COLORURINE STRAW (A) 11/13/2020 2204   APPEARANCEUR CLEAR 11/13/2020 2204   LABSPEC 1.010 11/13/2020 2204   PHURINE 5.0 11/13/2020 2204   GLUCOSEU NEGATIVE 11/13/2020 2204   HGBUR NEGATIVE 11/13/2020 2204   BILIRUBINUR NEGATIVE 11/13/2020 2204   KETONESUR NEGATIVE 11/13/2020 2204   PROTEINUR NEGATIVE 11/13/2020 2204   NITRITE NEGATIVE 11/13/2020 2204   LEUKOCYTESUR NEGATIVE 11/13/2020 2204   Sepsis Labs: Invalid input(s): PROCALCITONIN, LACTICIDVEN  Microbiology: No results found for this or any previous visit (from the past 240 hour(s)).  Radiology Studies: CT ABDOMEN PELVIS WO CONTRAST  Result Date: 11/13/2020 CLINICAL DATA:  Left lower quadrant pain EXAM: CT ABDOMEN AND PELVIS WITHOUT CONTRAST  TECHNIQUE: Multidetector CT imaging of the abdomen and pelvis was performed following the standard protocol without oral or IV contrast. COMPARISON:  September 12, 2019 FINDINGS: Lower chest: Lung bases are clear. There is a moderate hiatal type hernia present. Hepatobiliary: There is hepatic steatosis. No focal liver lesions are apparent on this noncontrast enhanced study. Liver measures 22.1 cm in length. Gallbladder wall is not appreciably thickened. There is no appreciable biliary duct dilatation. Pancreas: There is no pancreatic mass or inflammatory focus. Spleen: No splenic lesions are evident. Adrenals/Urinary Tract: Adrenals bilaterally appear normal. There is no evident renal mass or hydronephrosis on either side. There is no appreciable renal or ureteral calculus on either side. Urinary bladder is midline with wall thickness within normal limits. Stomach/Bowel: There are sigmoid diverticula with several irregular diverticula. There is wall thickening in the distal sigmoid colon with soft tissue stranding consistent with diverticulitis in the distal sigmoid colon region. There is no abscess or perforation in this area. Elsewhere, there is no appreciable bowel wall thickening. There is no appreciable bowel obstruction. Terminal ileum appears unremarkable. No appendiceal region inflammation. No free air or portal venous air. Vascular/Lymphatic: There is no abdominal aortic aneurysm. No vascular lesions evident on this noncontrast enhanced study. There is no evident adenopathy in the abdomen or pelvis. Reproductive: Uterus absent.  No adnexal masses evident. Other: No ascites or abscess evident in the abdomen or pelvis. There is a focal umbilical hernia containing fat but no bowel. This hernia measures 2.8 cm from right to left dimension at its neck. The neck of the hernia measures 1.8 cm from superior to inferior dimension. No bowel containing hernia evident. Musculoskeletal: No blastic or lytic bone lesions. No  intramuscular lesions evident. IMPRESSION: 1. Distal sigmoid diverticulitis with wall thickening and soft tissue stranding in this area. No abscess or perforation evident. 2. No bowel obstruction. No abscess in the abdomen or pelvis. The periappendiceal region appears unremarkable. 3.  Moderate hiatal type hernia again noted. 4.  Hepatic steatosis.  Prominent liver measuring 22.1 cm in length. 5. No renal or ureteral calculus. No hydronephrosis. Urinary  bladder wall thickness normal. 6.  Umbilical hernia containing fat but no bowel. 7.  Uterus absent. Electronically Signed   By: Bretta Bang III M.D.   On: 11/13/2020 22:58      Sage Kopera T. Cordelle Dahmen [facility]  If 7PM-7AM, please contact night-coverage [email or web address] 11/14/2020, 10:14 AM

## 2020-11-15 LAB — RENAL FUNCTION PANEL
Albumin: 3.3 g/dL — ABNORMAL LOW (ref 3.5–5.0)
Anion gap: 3 — ABNORMAL LOW (ref 5–15)
BUN: 6 mg/dL (ref 6–20)
CO2: 29 mmol/L (ref 22–32)
Calcium: 8.4 mg/dL — ABNORMAL LOW (ref 8.9–10.3)
Chloride: 105 mmol/L (ref 98–111)
Creatinine, Ser: 0.54 mg/dL (ref 0.44–1.00)
GFR, Estimated: >60 mL/min (ref >60)
Glucose, Bld: 98 mg/dL (ref 70–99)
Phosphorus: 3.3 mg/dL (ref 2.5–4.6)
Potassium: 4.4 mmol/L (ref 3.5–5.1)
Sodium: 137 mmol/L (ref 135–145)

## 2020-11-15 LAB — IRON AND TIBC
Iron: 35 ug/dL (ref 28–170)
Saturation Ratios: 12 % (ref 10.4–31.8)
TIBC: 304 ug/dL (ref 250–450)
UIBC: 269 ug/dL

## 2020-11-15 LAB — CBC
HCT: 33.1 % — ABNORMAL LOW (ref 36.0–46.0)
Hemoglobin: 9.7 g/dL — ABNORMAL LOW (ref 12.0–15.0)
MCH: 26.1 pg (ref 26.0–34.0)
MCHC: 29.3 g/dL — ABNORMAL LOW (ref 30.0–36.0)
MCV: 89.2 fL (ref 80.0–100.0)
Platelets: 214 10*3/uL (ref 150–400)
RBC: 3.71 MIL/uL — ABNORMAL LOW (ref 3.87–5.11)
RDW: 19 % — ABNORMAL HIGH (ref 11.5–15.5)
WBC: 6.1 10*3/uL (ref 4.0–10.5)
nRBC: 0 % (ref 0.0–0.2)

## 2020-11-15 LAB — RETICULOCYTES
Immature Retic Fract: 21.4 % — ABNORMAL HIGH (ref 2.3–15.9)
RBC.: 3.73 MIL/uL — ABNORMAL LOW (ref 3.87–5.11)
Retic Count, Absolute: 54.8 10*3/uL (ref 19.0–186.0)
Retic Ct Pct: 1.5 % (ref 0.4–3.1)

## 2020-11-15 LAB — FOLATE: Folate: 9.7 ng/mL (ref >5.9)

## 2020-11-15 LAB — MAGNESIUM: Magnesium: 2.1 mg/dL (ref 1.7–2.4)

## 2020-11-15 LAB — LIPASE, BLOOD: Lipase: 24 U/L (ref 11–51)

## 2020-11-15 LAB — VITAMIN B12: Vitamin B-12: 312 pg/mL (ref 180–914)

## 2020-11-15 LAB — GLUCOSE, CAPILLARY: Glucose-Capillary: 100 mg/dL — ABNORMAL HIGH (ref 70–99)

## 2020-11-15 LAB — FERRITIN: Ferritin: 117 ng/mL (ref 11–307)

## 2020-11-15 MED ORDER — OXYCODONE HCL 5 MG PO TABS
5.0000 mg | ORAL_TABLET | ORAL | Status: DC | PRN
  Administered 2020-11-15 – 2020-11-16 (×3): 5 mg via ORAL
  Filled 2020-11-15 (×3): qty 1

## 2020-11-15 MED ORDER — HYDROMORPHONE HCL 1 MG/ML IJ SOLN
0.5000 mg | INTRAMUSCULAR | Status: DC | PRN
  Administered 2020-11-15 (×2): 0.5 mg via INTRAVENOUS
  Filled 2020-11-15 (×2): qty 0.5

## 2020-11-15 NOTE — Progress Notes (Signed)
PROGRESS NOTE  Carmen Ramos RXV:[id number] DOB: 09-Dec-1975   PCP: Burnis Medin, PA-C  Patient is from: Home  DOA: 11/13/2020 LOS: 1  Chief complaints: Abdominal pain  Brief Narrative / Interim history: 45 year old F with PMH of diverticulitis, HTN, asthma, anxiety and iron deficiency anemia presenting with LLQ abdominal pain for 2 days and admitted for acute uncomplicated diverticulitis without abscess or perforation.  Started on IV Cipro and Flagyl and transition to IV Unasyn.   Subjective: Seen and examined earlier this morning.  No major events overnight of this morning.  Reports having trouble falling sleep.  Continues to endorse LLQ pain.  Pain ranges from 4-8 on a scale of 10.  Improved with IV pain medication.  On chart review, she has been getting IV Dilaudid around-the-clock.  We have discussed the pros and cons of IV Dilaudid including potential for ileus and other complications.  She has agreed on spacing out and cutting down.   Objective: Vitals:   11/14/20 1747 11/14/20 2045 11/15/20 0116 11/15/20 0515  BP: 131/66 131/66 124/63 124/62  Pulse: 63 (!) 58 (!) 59 (!) 59  Resp: 17 15 17 16   Temp: 98.1 F (36.7 C) 98.2 F (36.8 C) 98 F (36.7 C) 98.2 F (36.8 C)  TempSrc:      SpO2: 92% 100% 95% (!) 85%    Intake/Output Summary (Last 24 hours) at 11/15/2020 1201 Last data filed at 11/15/2020 0600 Gross per 24 hour  Intake 2749.92 ml  Output --  Net 2749.92 ml   There were no vitals filed for this visit.  Examination:  GENERAL: No apparent distress.  Nontoxic. HEENT: MMM.  Vision and hearing grossly intact.  NECK: Supple.  No apparent JVD.  RESP: On RA.  No IWOB.  Fair aeration bilaterally. CVS:  RRR. Heart sounds normal.  ABD/GI/GU: BS+. Abd soft.  LLQ tenderness.  No rebound. MSK/EXT:  Moves extremities. No apparent deformity. No edema.  SKIN: no apparent skin lesion or wound NEURO: Sleepy but wakes to voice easily.  Oriented x4.  No apparent  focal neuro deficit. PSYCH: Calm. Normal affect.   Procedures:  None  Microbiology summarized: COVID-19 PCR nonreactive.  Assessment & Plan: Acute sigmoid diverticulitis without perforation or abscess-previous history of diverticulitis about 3 years ago.  Continues to endorse significant LLQ pain requiring IV pain medications.  -Continue IV Unasyn -Decrease IV Dilaudid to 0.5 mg every 3 hours as needed severe pain -Codon 5 mg every 6 hours as needed moderate pain -Full liquid diet -Continue IV fluid -As needed antiemetics as needed for nausea and emesis.  Hypokalemia: Likely due to lisinopril and HCTZ.  Resolved.  Elevated lipase: No clinical or radiologic evidence of pancreatitis.  Resolved.  History of peptic ulcer disease -Continue IV Protonix and Carafate  Essential hypertension: Normotensive. -Continue home lisinopril and metoprolol -As needed labetalol -Continue holding HCTZ.  History of asthma?  Does not seem to medication.  Stable.  Anxiety -Continue home Celexa.  History of iron deficiency anemia: Slight drop in Hgb likely dilutional.  Panel normal. Recent Labs    11/13/20 2217 11/14/20 0450 11/15/20 0313  HGB 11.5* 9.9* 9.7*  -Monitor intermittently.  There is no height or weight on file to calculate BMI.         DVT prophylaxis:  heparin injection 5,000 Units Start: 11/14/20 0600  Code Status: Full code Family Communication: Updated patient's sister at bedside 5/15.  None at bedside today.. Level of care: Med-Surg Status is: Inpatient  Remains  inpatient appropriate because:Ongoing active pain requiring inpatient pain management, IV treatments appropriate due to intensity of illness or inability to take PO and Inpatient level of care appropriate due to severity of illness   Dispo: The patient is from: Home              Anticipated d/c is to: Home              Patient currently is not medically stable to d/c.   Difficult to place patient  No            Consultants:  None   Sch Meds:  Scheduled Meds: . citalopram  30 mg Oral Daily  . heparin  5,000 Units Subcutaneous Q8H  . lisinopril  20 mg Oral Daily  . metoprolol succinate  25 mg Oral Daily  . pantoprazole (PROTONIX) IV  40 mg Intravenous Q12H  . sucralfate  1 g Oral TID WC & HS   Continuous Infusions: . ampicillin-sulbactam (UNASYN) IV 3 g (11/15/20 0839)   PRN Meds:.HYDROmorphone (DILAUDID) injection, labetalol, ondansetron (ZOFRAN) IV, oxyCODONE, polyvinyl alcohol  Antimicrobials: Anti-infectives (From admission, onward)   Start     Dose/Rate Route Frequency Ordered Stop   11/14/20 1200  ciprofloxacin (CIPRO) IVPB 400 mg  Status:  Discontinued        400 mg 200 mL/hr over 60 Minutes Intravenous Every 12 hours 11/14/20 0436 11/14/20 0739   11/14/20 0830  Ampicillin-Sulbactam (UNASYN) 3 g in sodium chloride 0.9 % 100 mL IVPB        3 g 200 mL/hr over 30 Minutes Intravenous Every 6 hours 11/14/20 0739     11/14/20 0800  metroNIDAZOLE (FLAGYL) IVPB 500 mg  Status:  Discontinued        500 mg 100 mL/hr over 60 Minutes Intravenous Every 8 hours 11/14/20 0419 11/14/20 0739   11/13/20 2330  metroNIDAZOLE (FLAGYL) tablet 500 mg        500 mg Oral  Once 11/13/20 2315 11/14/20 0004   11/13/20 2330  ciprofloxacin (CIPRO) tablet 500 mg        500 mg Oral  Once 11/13/20 2315 11/14/20 0004   11/13/20 0000  metroNIDAZOLE (FLAGYL) 500 MG tablet        500 mg Oral 3 times daily 11/13/20 2319     11/13/20 0000  ciprofloxacin (CIPRO) 500 MG tablet        500 mg Oral 2 times daily 11/13/20 2319         I have personally reviewed the following labs and images: CBC: Recent Labs  Lab 11/13/20 2217 11/14/20 0450 11/15/20 0313  WBC 13.3* 10.0 6.1  NEUTROABS 9.6* 6.5  --   HGB 11.5* 9.9* 9.7*  HCT 38.1 33.0* 33.1*  MCV 84.5 85.9 89.2  PLT 307 244 214   BMP &GFR Recent Labs  Lab 11/13/20 2217 11/14/20 0450 11/15/20 0313  NA 137 136 137  K 3.3* 2.9*  4.4  CL 98 101 105  CO2 29 27 29   GLUCOSE 111* 105* 98  BUN 11 10 6   CREATININE 0.70 0.52 0.54  CALCIUM 9.3 8.3* 8.4*  MG  --  1.8 2.1  PHOS  --   --  3.3   CrCl cannot be calculated (Unknown ideal weight.). Liver & Pancreas: Recent Labs  Lab 11/13/20 2217 11/14/20 0450 11/15/20 0313  AST 17 14*  --   ALT 17 15  --   ALKPHOS 65 54  --   BILITOT 0.1* 0.2*  --  PROT 8.2* 6.7  --   ALBUMIN 4.2 3.4* 3.3*   Recent Labs  Lab 11/13/20 2217 11/14/20 0450 11/15/20 0313  LIPASE 86* 26 24   No results for input(s): AMMONIA in the last 168 hours. Diabetic: No results for input(s): HGBA1C in the last 72 hours. Recent Labs  Lab 11/14/20 0612 11/14/20 1121 11/14/20 1754 11/14/20 2351 11/15/20 0519  GLUCAP 100* 146* 100* 118* 100*   Cardiac Enzymes: No results for input(s): CKTOTAL, CKMB, CKMBINDEX, TROPONINI in the last 168 hours. No results for input(s): PROBNP in the last 8760 hours. Coagulation Profile: No results for input(s): INR, PROTIME in the last 168 hours. Thyroid Function Tests: No results for input(s): TSH, T4TOTAL, FREET4, T3FREE, THYROIDAB in the last 72 hours. Lipid Profile: No results for input(s): CHOL, HDL, LDLCALC, TRIG, CHOLHDL, LDLDIRECT in the last 72 hours. Anemia Panel: Recent Labs    11/15/20 0313  VITAMINB12 312  FOLATE 9.7  FERRITIN 117  TIBC 304  IRON 35  RETICCTPCT 1.5   Urine analysis:    Component Value Date/Time   COLORURINE STRAW (A) 11/13/2020 2204   APPEARANCEUR CLEAR 11/13/2020 2204   LABSPEC 1.010 11/13/2020 2204   PHURINE 5.0 11/13/2020 2204   GLUCOSEU NEGATIVE 11/13/2020 2204   HGBUR NEGATIVE 11/13/2020 2204   BILIRUBINUR NEGATIVE 11/13/2020 2204   KETONESUR NEGATIVE 11/13/2020 2204   PROTEINUR NEGATIVE 11/13/2020 2204   NITRITE NEGATIVE 11/13/2020 2204   LEUKOCYTESUR NEGATIVE 11/13/2020 2204   Sepsis Labs: Invalid input(s): PROCALCITONIN, LACTICIDVEN  Microbiology: Recent Results (from the past 240 hour(s))   SARS CORONAVIRUS 2 (TAT 6-24 HRS) Nasopharyngeal Nasopharyngeal Swab     Status: None   Collection Time: 11/14/20  1:57 AM   Specimen: Nasopharyngeal Swab  Result Value Ref Range Status   SARS Coronavirus 2 NEGATIVE NEGATIVE Final    Comment: (NOTE) SARS-CoV-2 target nucleic acids are NOT DETECTED.  The SARS-CoV-2 RNA is generally detectable in upper and lower respiratory specimens during the acute phase of infection. Negative results do not preclude SARS-CoV-2 infection, do not rule out co-infections with other pathogens, and should not be used as the sole basis for treatment or other patient management decisions. Negative results must be combined with clinical observations, patient history, and epidemiological information. The expected result is Negative.  Fact Sheet for Patients: HairSlick.no  Fact Sheet for Healthcare Providers: quierodirigir.com  This test is not yet approved or cleared by the [place] FDA and  has been authorized for detection and/or diagnosis of SARS-CoV-2 by FDA under an Emergency Use Authorization (EUA). This EUA will remain  in effect (meaning this test can be used) for the duration of the COVID-19 declaration under Se ction 564(b)(1) of the Act, 21 U.S.C. section 360bbb-3(b)(1), unless the authorization is terminated or revoked sooner.  Performed at [facility], [address], [place], [place] [address]     Radiology Studies: No results found.    Kaelem Brach T. Elester [facility]  If 7PM-7AM, please contact night-coverage [email or web address] 11/15/2020, 12:01 PM

## 2020-11-16 DIAGNOSIS — K279 Peptic ulcer, site unspecified, unspecified as acute or chronic, without hemorrhage or perforation: Secondary | ICD-10-CM

## 2020-11-16 LAB — HEMOGLOBIN AND HEMATOCRIT, BLOOD
HCT: 31.8 % — ABNORMAL LOW (ref 36.0–46.0)
Hemoglobin: 9.6 g/dL — ABNORMAL LOW (ref 12.0–15.0)

## 2020-11-16 MED ORDER — AMOXICILLIN-POT CLAVULANATE 875-125 MG PO TABS: 1.0000 | ORAL_TABLET | Freq: Two times a day (BID) | ORAL | 0 refills | Status: AC

## 2020-11-16 NOTE — Progress Notes (Signed)
Discharge instructions discussed with patient, verbalized agreement and understanding 

## 2020-11-16 NOTE — Discharge Instructions (Signed)
Please read and follow all provided instructions.  Your diagnoses today include:  1. Acute diverticulitis   2. Lower abdominal pain     Tests performed today include:  Blood cell counts and platelets - high white blood cell count  Kidney and liver function tests  Pancreas function test (called lipase) - slightly high, uncertain cause  Urine test to look for infection - no infection  CT scan - shows acute diverticulitis, also small umbilical hernia  Vital signs. See below for your results today.   Medications prescribed:   Ciprofloxacin - antibiotic  You have been prescribed an antibiotic medicine: take the entire course of medicine even if you are feeling better. Stopping early can cause the antibiotic not to work.   Metronidazole - antibiotic  You have been prescribed an antibiotic medicine: take the entire course of medicine even if you are feeling better. Stopping early can cause the antibiotic not to work. Do not drink alcohol when taking this medication.    Percocet (oxycodone/acetaminophen) - narcotic pain medication  DO NOT drive or perform any activities that require you to be awake and alert because this medicine can make you drowsy. BE VERY CAREFUL not to take multiple medicines containing Tylenol (also called acetaminophen). Doing so can lead to an overdose which can damage your liver and cause liver failure and possibly death.  Take any prescribed medications only as directed.  Home care instructions:   Follow any educational materials contained in this packet.  Follow-up instructions: Please follow-up with your primary care provider in the next 3 days for further evaluation of your symptoms.    Return instructions:  SEEK IMMEDIATE MEDICAL ATTENTION IF:  The pain does not go away or becomes severe   A temperature above 101F develops   Repeated vomiting occurs (multiple episodes)   Blood is being passed in stools or vomit (bright red or black tarry  stools)   You develop chest pain, difficulty breathing, dizziness or fainting, or become confused, poorly responsive, or inconsolable (young children)  If you have any other emergent concerns regarding your health  Additional Information: Abdominal (belly) pain can be caused by many things. Your caregiver performed an examination and possibly ordered blood/urine tests and imaging (CT scan, x-rays, ultrasound). Many cases can be observed and treated at home after initial evaluation in the emergency department. Even though you are being discharged home, abdominal pain can be unpredictable. Therefore, you need a repeated exam if your pain does not resolve, returns, or worsens. Most patients with abdominal pain don't have to be admitted to the hospital or have surgery, but serious problems like appendicitis and gallbladder attacks can start out as nonspecific pain. Many abdominal conditions cannot be diagnosed in one visit, so follow-up evaluations are very important.  Your vital signs today were: BP 133/74 (BP Location: Left Arm)   Pulse 90   Temp 98.7 F (37.1 C) (Oral)   Resp 20   SpO2 96%  If your blood pressure (bp) was elevated above 135/85 this visit, please have this repeated by your doctor within one month. --------------

## 2020-11-16 NOTE — Discharge Summary (Signed)
Physician Discharge Summary  Carmen Ramos QZR:[id number] DOB: [birth year] DOA: 11/13/2020  PCP: Burnis Medin, PA-C  Admit date: 11/13/2020 Discharge date: 11/16/2020  Admitted From: Home Disposition: Home  Recommendations for Outpatient Follow-up:  1. Follow ups as below. 2. Please obtain CBC/BMP/Mag at follow up 3. Ambulatory referral to GI for colonoscopy once diverticulitis resolves 4. Please follow up on the following pending results: None  Home Health: None required Equipment/Devices: None required  Discharge Condition: Stable CODE STATUS: Full code   Follow-up Information    [place], [place] E, PA-C In 3 days.   Specialty: Family Medicine Contact information: [address] [place] [address] [phone]                Hospital Course: 45 year old F with PMH of diverticulitis, HTN, asthma, anxiety and iron deficiency anemia presenting with LLQ abdominal pain for 2 days and admitted for acute uncomplicated diverticulitis without abscess or perforation.  Started on IV Cipro and Flagyl and transitioned to IV Unasyn.   Eventually, patient symptoms improved.  She tolerated soft diet.  Discharged on p.o. Augmentin to complete a total of 10 days course.  She needs outpatient follow-up with GI for colonoscopy once diverticulitis resolves completely.  See individual problem list below for more on hospital course.  Discharge Diagnoses:  Acute sigmoid diverticulitis without perforation or abscess-previous history of diverticulitis about 3 years ago.  Pain improved.  Tolerated soft diet. -Received IV Unasyn.  Discharged on p.o. Augmentin to complete a total of 10 days course -She already has a prescription for Percocet from ED -Advised to continue soft diet for the next 3 to 4 days  Hypokalemia: Likely due to lisinopril and HCTZ.  Resolved.  Elevated lipase: No clinical or radiologic evidence of pancreatitis.  Resolved.  History of  peptic ulcer disease -Continue home PPI  Essential hypertension: Normotensive. -Continue  home medications  History of asthma?  Does not seem to medication.  Stable.  Anxiety: Stable -Continue home Celexa.  History of iron deficiency anemia: Slight drop in Hgb likely dilutional.    Anemia panel normal. -Repeat CBC at follow-up      Discharge Exam: Vitals:   11/15/20 2140 11/16/20 0541  BP: (!) 143/60 (!) 113/54  Pulse: 70 (!) 57  Resp: 18 18  Temp: 98.7 F (37.1 C) 98.4 F (36.9 C)  SpO2: 97% 99%    GENERAL: No apparent distress.  Nontoxic. HEENT: MMM.  Vision and hearing grossly intact.  NECK: Supple.  No apparent JVD.  RESP: On RA no IWOB.  Fair aeration bilaterally. CVS:  RRR. Heart sounds normal.  ABD/GI/GU: Bowel sounds present. Soft.  Mild LLQ tenderness.  No rebound. MSK/EXT:  Moves extremities. No apparent deformity. No edema.  SKIN: no apparent skin lesion or wound NEURO: Awake, alert and oriented appropriately.  No apparent focal neuro deficit. PSYCH: Calm. Normal affect.   Discharge Instructions  Discharge Instructions    Call MD for:  difficulty breathing, headache or visual disturbances   Complete by: As directed    Call MD for:  extreme fatigue   Complete by: As directed    Call MD for:  persistant dizziness or light-headedness   Complete by: As directed    Call MD for:  persistant nausea and vomiting   Complete by: As directed    Call MD for:  severe uncontrolled pain   Complete by: As directed    Diet - low sodium heart healthy   Complete by:  As directed    Soft diet for the next 3 to 4 days.  Then high-fiber diet after that   Discharge instructions   Complete by: As directed    It has been a pleasure taking care of you!  You were hospitalized with abdominal pain due to diverticulitis.  You have been treated with IV antibiotics and your symptoms improved to the point we think it is safe to let you go home and follow-up with your primary  care doctor.  We are discharging you on more antibiotics to complete treatment course.  We also recommend doing soft diet for the next 3 to 4 days.  Follow-up with your primary care doctor in 1 to 2 weeks.  You need to follow-up with gastroenterology in about 3 to 4 weeks for colonoscopy once the infection is fully treated.  Please review your new medication list and the directions on your medications before you take them.  Please be cautious with the pain medication as it could cause sedation, constipation and increased risk of fall.  We do not recommend driving or operating machinery while taking this medication.     Take care,   Increase activity slowly   Complete by: As directed      Allergies as of 11/16/2020      Reactions   Ferumoxytol Itching   Gluten Meal    Arthritis, IBS   Latex Rash      Medication List    TAKE these medications   acetaminophen 325 MG tablet Commonly known as: TYLENOL Take 650 mg by mouth every 6 (six) hours as needed for moderate pain.   amoxicillin-clavulanate 875-125 MG tablet Commonly known as: Augmentin Take 1 tablet by mouth 2 (two) times daily for 8 days.   carboxymethylcellulose 0.5 % Soln Commonly known as: REFRESH PLUS Place 1 drop into both eyes 3 (three) times daily as needed (dry eyes).   cetirizine 10 MG tablet Commonly known as: ZYRTEC Take 10 mg by mouth daily as needed for allergies.   citalopram 20 MG tablet Commonly known as: CELEXA Take 30 mg by mouth daily.   lisinopril-hydrochlorothiazide 20-25 MG tablet Commonly known as: ZESTORETIC Take 1 tablet by mouth daily.   metoprolol succinate 25 MG 24 hr tablet Commonly known as: TOPROL-XL Take 25 mg by mouth daily.   oxyCODONE-acetaminophen 5-325 MG tablet Commonly known as: PERCOCET/ROXICET Take 1 tablet by mouth every 6 (six) hours as needed for severe pain.   pantoprazole 40 MG tablet Commonly known as: PROTONIX Take 1 tablet (40 mg total) by mouth 2 (two) times  daily before a meal.   sucralfate 1 g tablet Commonly known as: Carafate Take 1 tablet (1 g total) by mouth 4 (four) times daily -  with meals and at bedtime. What changed: when to take this   VITAMIN B-12 PO Take 1 tablet by mouth daily.       Consultations:  None  Procedures/Studies:   CT ABDOMEN PELVIS WO CONTRAST  Result Date: 11/13/2020 CLINICAL DATA:  Left lower quadrant pain EXAM: CT ABDOMEN AND PELVIS WITHOUT CONTRAST TECHNIQUE: Multidetector CT imaging of the abdomen and pelvis was performed following the standard protocol without oral or IV contrast. COMPARISON:  September 12, 2019 FINDINGS: Lower chest: Lung bases are clear. There is a moderate hiatal type hernia present. Hepatobiliary: There is hepatic steatosis. No focal liver lesions are apparent on this noncontrast enhanced study. Liver measures 22.1 cm in length. Gallbladder wall is not appreciably thickened. There is no  appreciable biliary duct dilatation. Pancreas: There is no pancreatic mass or inflammatory focus. Spleen: No splenic lesions are evident. Adrenals/Urinary Tract: Adrenals bilaterally appear normal. There is no evident renal mass or hydronephrosis on either side. There is no appreciable renal or ureteral calculus on either side. Urinary bladder is midline with wall thickness within normal limits. Stomach/Bowel: There are sigmoid diverticula with several irregular diverticula. There is wall thickening in the distal sigmoid colon with soft tissue stranding consistent with diverticulitis in the distal sigmoid colon region. There is no abscess or perforation in this area. Elsewhere, there is no appreciable bowel wall thickening. There is no appreciable bowel obstruction. Terminal ileum appears unremarkable. No appendiceal region inflammation. No free air or portal venous air. Vascular/Lymphatic: There is no abdominal aortic aneurysm. No vascular lesions evident on this noncontrast enhanced study. There is no evident  adenopathy in the abdomen or pelvis. Reproductive: Uterus absent.  No adnexal masses evident. Other: No ascites or abscess evident in the abdomen or pelvis. There is a focal umbilical hernia containing fat but no bowel. This hernia measures 2.8 cm from right to left dimension at its neck. The neck of the hernia measures 1.8 cm from superior to inferior dimension. No bowel containing hernia evident. Musculoskeletal: No blastic or lytic bone lesions. No intramuscular lesions evident. IMPRESSION: 1. Distal sigmoid diverticulitis with wall thickening and soft tissue stranding in this area. No abscess or perforation evident. 2. No bowel obstruction. No abscess in the abdomen or pelvis. The periappendiceal region appears unremarkable. 3.  Moderate hiatal type hernia again noted. 4.  Hepatic steatosis.  Prominent liver measuring 22.1 cm in length. 5. No renal or ureteral calculus. No hydronephrosis. Urinary bladder wall thickness normal. 6.  Umbilical hernia containing fat but no bowel. 7.  Uterus absent. Electronically Signed   By: Bretta Bang III M.D.   On: 11/13/2020 22:58        The results of significant diagnostics from this hospitalization (including imaging, microbiology, ancillary and laboratory) are listed below for reference.     Microbiology: Recent Results (from the past 240 hour(s))  SARS CORONAVIRUS 2 (TAT 6-24 HRS) Nasopharyngeal Nasopharyngeal Swab     Status: None   Collection Time: 11/14/20  1:57 AM   Specimen: Nasopharyngeal Swab  Result Value Ref Range Status   SARS Coronavirus 2 NEGATIVE NEGATIVE Final    Comment: (NOTE) SARS-CoV-2 target nucleic acids are NOT DETECTED.  The SARS-CoV-2 RNA is generally detectable in upper and lower respiratory specimens during the acute phase of infection. Negative results do not preclude SARS-CoV-2 infection, do not rule out co-infections with other pathogens, and should not be used as the sole basis for treatment or other patient  management decisions. Negative results must be combined with clinical observations, patient history, and epidemiological information. The expected result is Negative.  Fact Sheet for Patients: HairSlick.no  Fact Sheet for Healthcare Providers: quierodirigir.com  This test is not yet approved or cleared by the [place] FDA and  has been authorized for detection and/or diagnosis of SARS-CoV-2 by FDA under an Emergency Use Authorization (EUA). This EUA will remain  in effect (meaning this test can be used) for the duration of the COVID-19 declaration under Se ction 564(b)(1) of the Act, 21 U.S.C. section 360bbb-3(b)(1), unless the authorization is terminated or revoked sooner.  Performed at [facility], [address] [place], [place] [address]      Labs:  CBC: Recent Labs  Lab 11/13/20 2217 11/14/20 0450 11/15/20  [id number] 11/16/20 0426  WBC 13.3* 10.0 6.1  --   NEUTROABS 9.6* 6.5  --   --   HGB 11.5* 9.9* 9.7* 9.6*  HCT 38.1 33.0* 33.1* 31.8*  MCV 84.5 85.9 89.2  --   PLT 307 244 214  --    BMP &GFR Recent Labs  Lab 11/13/20 2217 11/14/20 0450 11/15/20 0313  NA 137 136 137  K 3.3* 2.9* 4.4  CL 98 101 105  CO2 29 27 29   GLUCOSE 111* 105* 98  BUN 11 10 6   CREATININE 0.70 0.52 0.54  CALCIUM 9.3 8.3* 8.4*  MG  --  1.8 2.1  PHOS  --   --  3.3   CrCl cannot be calculated (Unknown ideal weight.). Liver & Pancreas: Recent Labs  Lab 11/13/20 2217 11/14/20 0450 11/15/20 0313  AST 17 14*  --   ALT 17 15  --   ALKPHOS 65 54  --   BILITOT 0.1* 0.2*  --   PROT 8.2* 6.7  --   ALBUMIN 4.2 3.4* 3.3*   Recent Labs  Lab 11/13/20 2217 11/14/20 0450 11/15/20 0313  LIPASE 86* 26 24   No results for input(s): AMMONIA in the last 168 hours. Diabetic: No results for input(s): HGBA1C in the last 72 hours. Recent Labs  Lab 11/14/20 0612 11/14/20 1121 11/14/20 1754 11/14/20 2351 11/15/20 0519   GLUCAP 100* 146* 100* 118* 100*   Cardiac Enzymes: No results for input(s): CKTOTAL, CKMB, CKMBINDEX, TROPONINI in the last 168 hours. No results for input(s): PROBNP in the last 8760 hours. Coagulation Profile: No results for input(s): INR, PROTIME in the last 168 hours. Thyroid Function Tests: No results for input(s): TSH, T4TOTAL, FREET4, T3FREE, THYROIDAB in the last 72 hours. Lipid Profile: No results for input(s): CHOL, HDL, LDLCALC, TRIG, CHOLHDL, LDLDIRECT in the last 72 hours. Anemia Panel: Recent Labs    11/15/20 0313  VITAMINB12 312  FOLATE 9.7  FERRITIN 117  TIBC 304  IRON 35  RETICCTPCT 1.5   Urine analysis:    Component Value Date/Time   COLORURINE STRAW (A) 11/13/2020 2204   APPEARANCEUR CLEAR 11/13/2020 2204   LABSPEC 1.010 11/13/2020 2204   PHURINE 5.0 11/13/2020 2204   GLUCOSEU NEGATIVE 11/13/2020 2204   HGBUR NEGATIVE 11/13/2020 2204   BILIRUBINUR NEGATIVE 11/13/2020 2204   KETONESUR NEGATIVE 11/13/2020 2204   PROTEINUR NEGATIVE 11/13/2020 2204   NITRITE NEGATIVE 11/13/2020 2204   LEUKOCYTESUR NEGATIVE 11/13/2020 2204   Sepsis Labs: Invalid input(s): PROCALCITONIN, LACTICIDVEN   Time coordinating discharge: 35 minutes  SIGNED:  Almon Herculesaye T Dionel Archey, MD  [facility] 11/16/2020, 11:25 PM  If 7PM-7AM, please contact night-coverage [email or web address]

## 2021-10-27 IMAGING — CT CT ABD-PELV W/O CM
2 of 4 series · 15 of 46 positions shown, 17 images · non-contrast
Comparison: September 12, 2019

CLINICAL DATA: Left lower quadrant pain

EXAM:
CT ABDOMEN AND PELVIS WITHOUT CONTRAST
TECHNIQUE: Multidetector CT imaging of the abdomen and pelvis was performed
following the standard protocol without oral or IV contrast.

[Series 2: axial st · axial · 0.97mm/px · z∈[-510,-60]mm · 12 of 100 slices shown, 14 images]
[im 5/100  soft-tissue]
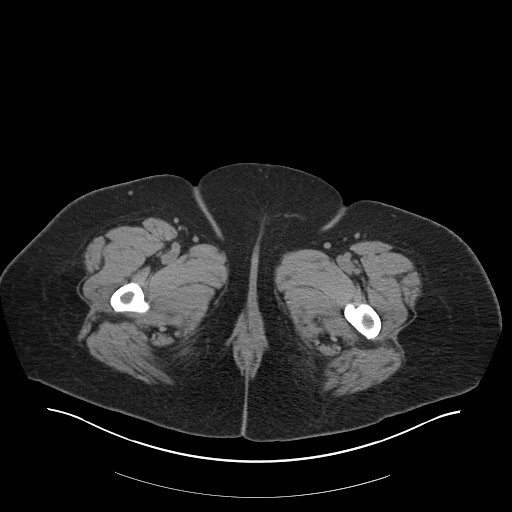
[im 5/100  bone]
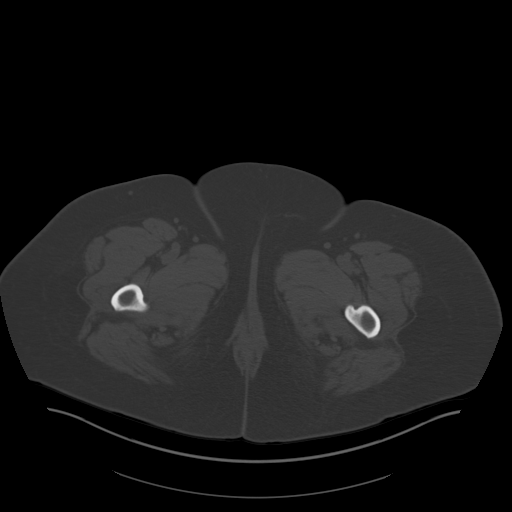
[im 15/100  soft-tissue]
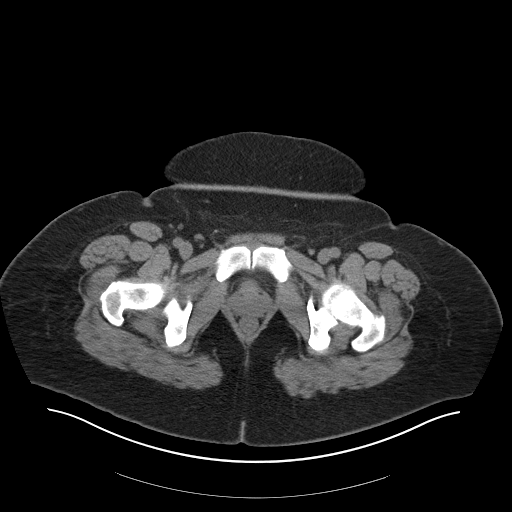
[im 20/100  soft-tissue]
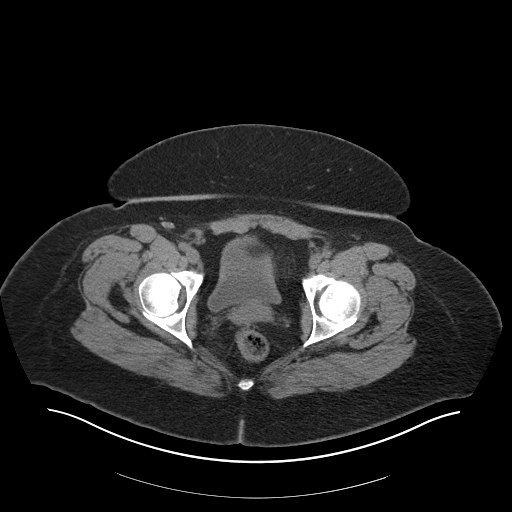
[im 30/100  soft-tissue]
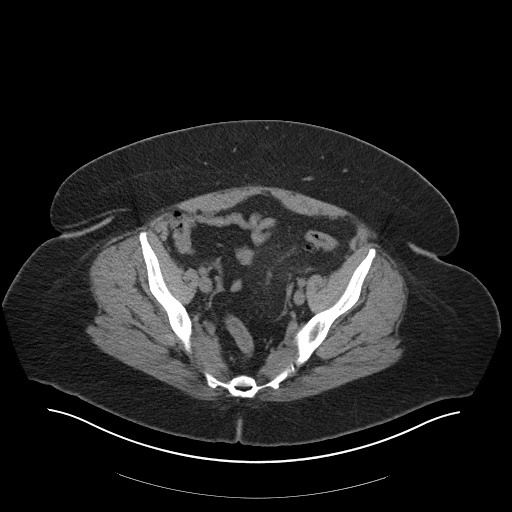
[im 40/100  soft-tissue]
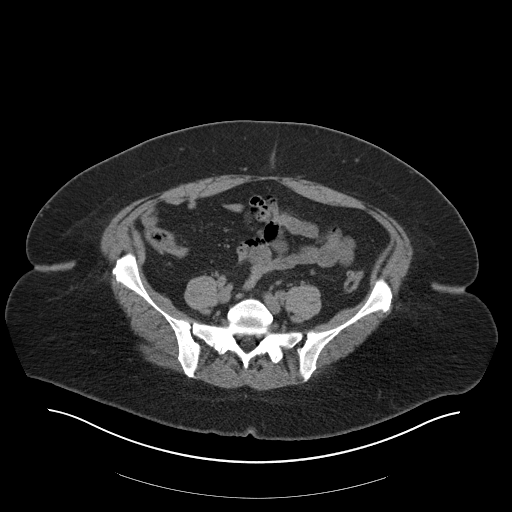
[im 45/100  soft-tissue]
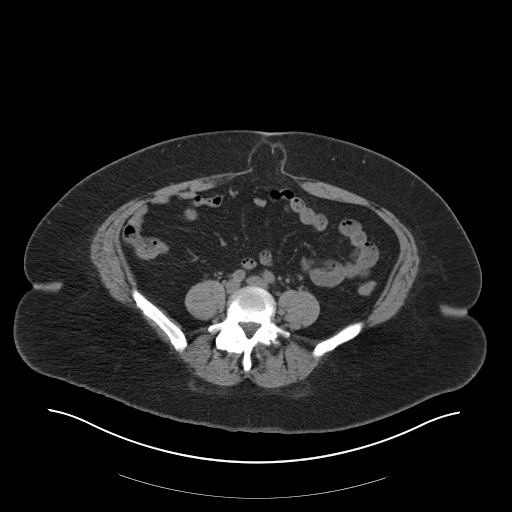
[im 55/100  soft-tissue]
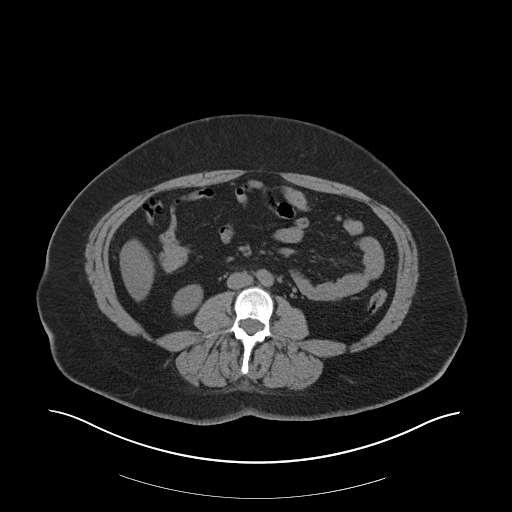
[im 60/100  soft-tissue]
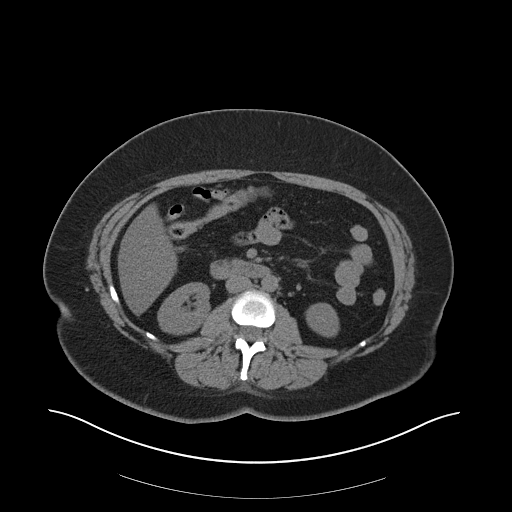
[im 70/100  soft-tissue]
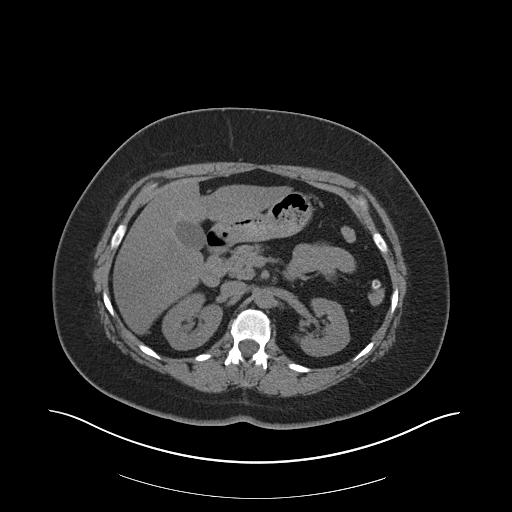
[im 70/100  bone]
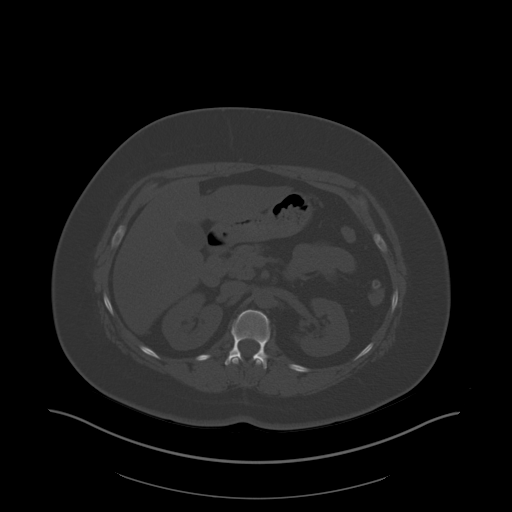
[im 80/100  soft-tissue]
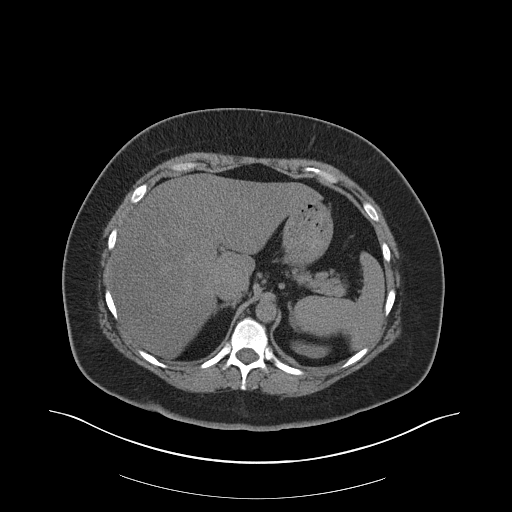
[im 85/100  soft-tissue]
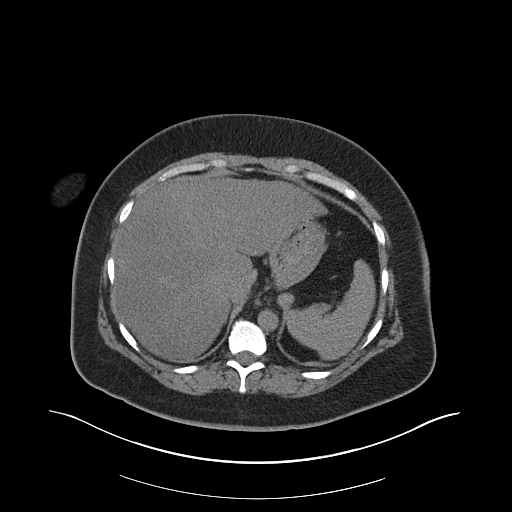
[im 95/100  soft-tissue]
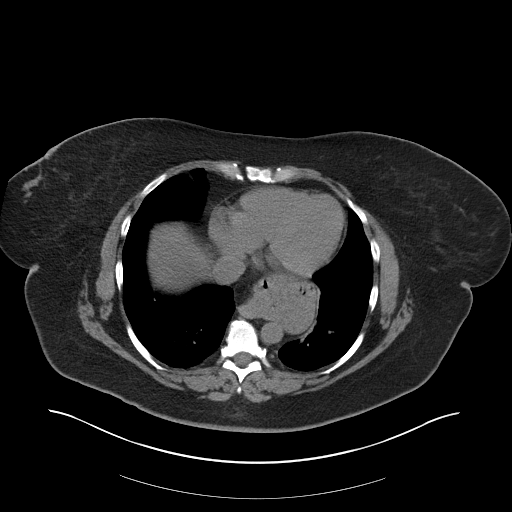

[Series 5: coronal st · coronal · 0.91mm/px · 3 of 170 slices shown]
[im 57/170  soft-tissue]
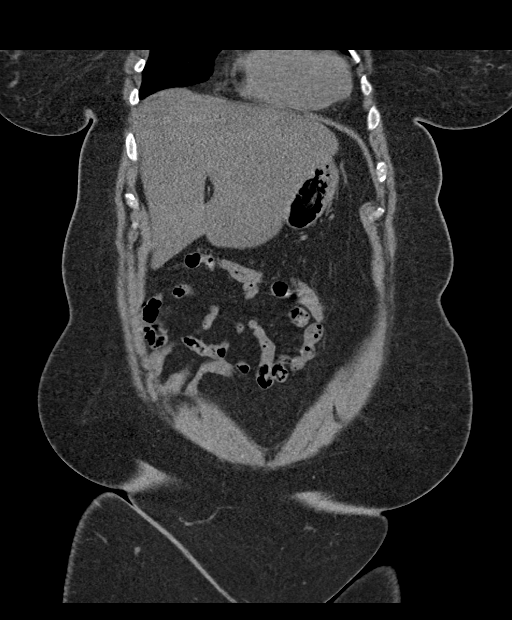
[im 76/170  soft-tissue]
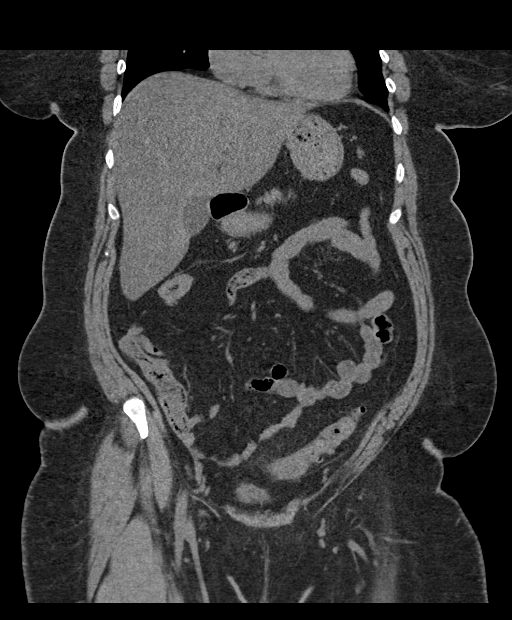
[im 94/170  soft-tissue]
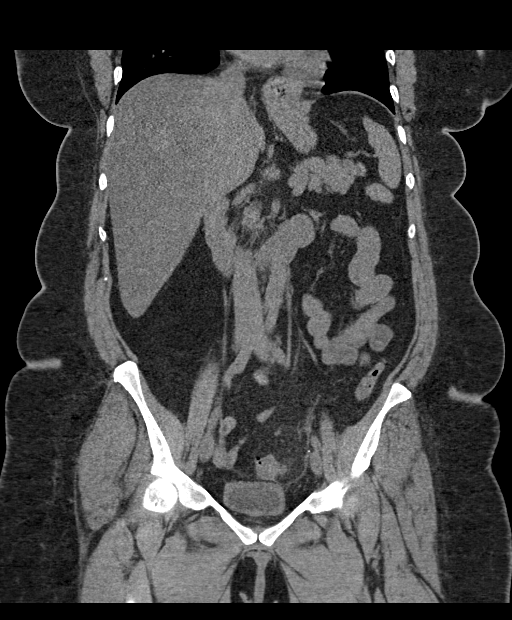

[15 of 46 positions shown; findings below may reference images not displayed]

FINDINGS: Lower chest: Lung bases are clear. There is a moderate hiatal type
hernia present.

Hepatobiliary: There is hepatic steatosis. No focal liver lesions
are apparent on this noncontrast enhanced study. Liver measures
cm in length. Gallbladder wall is not appreciably thickened. There
is no appreciable biliary duct dilatation.

Pancreas: There is no pancreatic mass or inflammatory focus.

Spleen: No splenic lesions are evident.

Adrenals/Urinary Tract: Adrenals bilaterally appear normal. There is
no evident renal mass or hydronephrosis on either side. There is no
appreciable renal or ureteral calculus on either side. Urinary
bladder is midline with wall thickness within normal limits.

Stomach/Bowel: There are sigmoid diverticula with several irregular
diverticula. There is wall thickening in the distal sigmoid colon
with soft tissue stranding consistent with diverticulitis in the
distal sigmoid colon region. There is no abscess or perforation in
this area. Elsewhere, there is no appreciable bowel wall thickening.
There is no appreciable bowel obstruction. Terminal ileum appears
unremarkable. No appendiceal region inflammation. No free air or
portal venous air.

Vascular/Lymphatic: There is no abdominal aortic aneurysm. No
vascular lesions evident on this noncontrast enhanced study. There
is no evident adenopathy in the abdomen or pelvis.

Reproductive: Uterus absent.  No adnexal masses evident.

Other: No ascites or abscess evident in the abdomen or pelvis. There
is a focal umbilical hernia containing fat but no bowel. This hernia
measures 2.8 cm from right to left dimension at its neck. The neck
of the hernia measures 1.8 cm from superior to inferior dimension.
No bowel containing hernia evident.

Musculoskeletal: No blastic or lytic bone lesions. No intramuscular
lesions evident.
IMPRESSION: 1. Distal sigmoid diverticulitis with wall thickening and soft
tissue stranding in this area. No abscess or perforation evident.

2. No bowel obstruction. No abscess in the abdomen or pelvis. The
periappendiceal region appears unremarkable.

3.  Moderate hiatal type hernia again noted.

4.  Hepatic steatosis.  Prominent liver measuring 22.1 cm in length.

5. No renal or ureteral calculus. No hydronephrosis. Urinary bladder
wall thickness normal.

6.  Umbilical hernia containing fat but no bowel.

7.  Uterus absent.

## 2024-06-15 ENCOUNTER — Emergency Department (HOSPITAL_COMMUNITY)

## 2024-06-15 ENCOUNTER — Inpatient Hospital Stay (HOSPITAL_COMMUNITY)
Admission: EM | Admit: 2024-06-15 | Discharge: 2024-06-18 | Disposition: A | Source: Home / Self Care | Attending: Internal Medicine | Admitting: Internal Medicine

## 2024-06-15 ENCOUNTER — Other Ambulatory Visit: Payer: Self-pay

## 2024-06-15 DIAGNOSIS — I10 Essential (primary) hypertension: Secondary | ICD-10-CM | POA: Diagnosis present

## 2024-06-15 DIAGNOSIS — K219 Gastro-esophageal reflux disease without esophagitis: Secondary | ICD-10-CM | POA: Diagnosis present

## 2024-06-15 DIAGNOSIS — K5792 Diverticulitis of intestine, part unspecified, without perforation or abscess without bleeding: Secondary | ICD-10-CM | POA: Diagnosis not present

## 2024-06-15 DIAGNOSIS — J45901 Unspecified asthma with (acute) exacerbation: Secondary | ICD-10-CM | POA: Diagnosis not present

## 2024-06-15 DIAGNOSIS — Z91040 Latex allergy status: Secondary | ICD-10-CM

## 2024-06-15 DIAGNOSIS — Z23 Encounter for immunization: Secondary | ICD-10-CM

## 2024-06-15 DIAGNOSIS — F32A Depression, unspecified: Secondary | ICD-10-CM | POA: Diagnosis present

## 2024-06-15 DIAGNOSIS — Z91018 Allergy to other foods: Secondary | ICD-10-CM | POA: Diagnosis not present

## 2024-06-15 DIAGNOSIS — Z888 Allergy status to other drugs, medicaments and biological substances status: Secondary | ICD-10-CM | POA: Diagnosis not present

## 2024-06-15 DIAGNOSIS — Z820 Family history of epilepsy and other diseases of the nervous system: Secondary | ICD-10-CM

## 2024-06-15 DIAGNOSIS — F419 Anxiety disorder, unspecified: Secondary | ICD-10-CM | POA: Diagnosis present

## 2024-06-15 DIAGNOSIS — Z6841 Body Mass Index (BMI) 40.0 and over, adult: Secondary | ICD-10-CM

## 2024-06-15 DIAGNOSIS — Z79899 Other long term (current) drug therapy: Secondary | ICD-10-CM | POA: Diagnosis not present

## 2024-06-15 DIAGNOSIS — K5732 Diverticulitis of large intestine without perforation or abscess without bleeding: Secondary | ICD-10-CM | POA: Diagnosis present

## 2024-06-15 DIAGNOSIS — Z90710 Acquired absence of both cervix and uterus: Secondary | ICD-10-CM | POA: Diagnosis not present

## 2024-06-15 DIAGNOSIS — E66813 Obesity, class 3: Secondary | ICD-10-CM | POA: Diagnosis present

## 2024-06-15 DIAGNOSIS — Z87891 Personal history of nicotine dependence: Secondary | ICD-10-CM

## 2024-06-15 DIAGNOSIS — E876 Hypokalemia: Secondary | ICD-10-CM | POA: Diagnosis present

## 2024-06-15 DIAGNOSIS — D500 Iron deficiency anemia secondary to blood loss (chronic): Secondary | ICD-10-CM | POA: Diagnosis present

## 2024-06-15 DIAGNOSIS — R52 Pain, unspecified: Secondary | ICD-10-CM

## 2024-06-15 LAB — LIPASE, BLOOD: Lipase: 28 U/L (ref 11–51)

## 2024-06-15 LAB — URINALYSIS, ROUTINE W REFLEX MICROSCOPIC
Bilirubin Urine: NEGATIVE
Glucose, UA: NEGATIVE mg/dL
Hgb urine dipstick: NEGATIVE
Ketones, ur: NEGATIVE mg/dL
Leukocytes,Ua: NEGATIVE
Nitrite: NEGATIVE
Protein, ur: NEGATIVE mg/dL
Specific Gravity, Urine: 1.011 (ref 1.005–1.030)
pH: 5 (ref 5.0–8.0)

## 2024-06-15 LAB — CBC
HCT: 38.8 % (ref 36.0–46.0)
Hemoglobin: 12.2 g/dL (ref 12.0–15.0)
MCH: 28 pg (ref 26.0–34.0)
MCHC: 31.4 g/dL (ref 30.0–36.0)
MCV: 89 fL (ref 80.0–100.0)
Platelets: 271 K/uL (ref 150–400)
RBC: 4.36 MIL/uL (ref 3.87–5.11)
RDW: 14.6 % (ref 11.5–15.5)
WBC: 12.4 K/uL — ABNORMAL HIGH (ref 4.0–10.5)
nRBC: 0 % (ref 0.0–0.2)

## 2024-06-15 LAB — COMPREHENSIVE METABOLIC PANEL WITH GFR
ALT: 31 U/L (ref 0–44)
AST: 29 U/L (ref 15–41)
Albumin: 4.2 g/dL (ref 3.5–5.0)
Alkaline Phosphatase: 84 U/L (ref 38–126)
Anion gap: 11 (ref 5–15)
BUN: 10 mg/dL (ref 6–20)
CO2: 29 mmol/L (ref 22–32)
Calcium: 9.5 mg/dL (ref 8.9–10.3)
Chloride: 95 mmol/L — ABNORMAL LOW (ref 98–111)
Creatinine, Ser: 0.72 mg/dL (ref 0.44–1.00)
GFR, Estimated: >60 mL/min (ref >60)
Glucose, Bld: 190 mg/dL — ABNORMAL HIGH (ref 70–99)
Potassium: 3.2 mmol/L — ABNORMAL LOW (ref 3.5–5.1)
Sodium: 136 mmol/L (ref 135–145)
Total Bilirubin: 0.3 mg/dL (ref 0.0–1.2)
Total Protein: 8.5 g/dL — ABNORMAL HIGH (ref 6.5–8.1)

## 2024-06-15 LAB — MAGNESIUM: Magnesium: 2 mg/dL (ref 1.7–2.4)

## 2024-06-15 LAB — I-STAT CG4 LACTIC ACID, ED: Lactic Acid, Venous: 1.1 mmol/L (ref 0.5–1.9)

## 2024-06-15 MED ORDER — HYDROMORPHONE HCL 1 MG/ML IJ SOLN
1.0000 mg | Freq: Once | INTRAMUSCULAR | Status: AC
  Administered 2024-06-15: 1 mg via INTRAVENOUS
  Filled 2024-06-15: qty 1

## 2024-06-15 MED ORDER — POTASSIUM CHLORIDE IN NACL 40-0.9 MEQ/L-% IV SOLN
INTRAVENOUS | Status: DC
  Filled 2024-06-15 (×2): qty 1000

## 2024-06-15 MED ORDER — INFLUENZA VIRUS VACC SPLIT PF (FLUZONE) 0.5 ML IM SUSY
0.5000 mL | PREFILLED_SYRINGE | INTRAMUSCULAR | Status: AC
  Administered 2024-06-16: 10:00:00 0.5 mL via INTRAMUSCULAR
  Filled 2024-06-15: qty 0.5

## 2024-06-15 MED ORDER — METRONIDAZOLE 500 MG/100ML IV SOLN
500.0000 mg | Freq: Two times a day (BID) | INTRAVENOUS | Status: DC
  Administered 2024-06-16 – 2024-06-18 (×5): 500 mg via INTRAVENOUS
  Filled 2024-06-15 (×5): qty 100

## 2024-06-15 MED ORDER — MORPHINE SULFATE (PF) 4 MG/ML IV SOLN
4.0000 mg | INTRAVENOUS | Status: DC | PRN
  Administered 2024-06-15 – 2024-06-16 (×4): 4 mg via INTRAVENOUS
  Filled 2024-06-15 (×4): qty 1

## 2024-06-15 MED ORDER — ONDANSETRON HCL 4 MG/2ML IJ SOLN
4.0000 mg | Freq: Four times a day (QID) | INTRAMUSCULAR | Status: DC | PRN
  Administered 2024-06-16 (×3): 4 mg via INTRAVENOUS
  Filled 2024-06-15 (×3): qty 2

## 2024-06-15 MED ORDER — IOHEXOL 300 MG/ML  SOLN
100.0000 mL | Freq: Once | INTRAMUSCULAR | Status: AC | PRN
  Administered 2024-06-15: 100 mL via INTRAVENOUS

## 2024-06-15 MED ORDER — LORAZEPAM 1 MG PO TABS
1.0000 mg | ORAL_TABLET | Freq: Once | ORAL | Status: AC
  Administered 2024-06-15: 1 mg via ORAL
  Filled 2024-06-15: qty 1

## 2024-06-15 MED ORDER — ONDANSETRON HCL 4 MG/2ML IJ SOLN
4.0000 mg | Freq: Once | INTRAMUSCULAR | Status: AC
  Administered 2024-06-15: 4 mg via INTRAVENOUS
  Filled 2024-06-15: qty 2

## 2024-06-15 MED ORDER — ACETAMINOPHEN 325 MG PO TABS: 650.0000 mg | ORAL_TABLET | Freq: Four times a day (QID) | ORAL | Status: DC | PRN

## 2024-06-15 MED ORDER — SODIUM CHLORIDE 0.9 % IV SOLN
2.0000 g | Freq: Once | INTRAVENOUS | Status: AC
  Administered 2024-06-15: 2 g via INTRAVENOUS
  Filled 2024-06-15: qty 20

## 2024-06-15 MED ORDER — POTASSIUM CHLORIDE CRYS ER 20 MEQ PO TBCR
40.0000 meq | EXTENDED_RELEASE_TABLET | Freq: Once | ORAL | Status: AC
  Administered 2024-06-15: 40 meq via ORAL
  Filled 2024-06-15: qty 2

## 2024-06-15 MED ORDER — ONDANSETRON HCL 4 MG PO TABS: 4.0000 mg | ORAL_TABLET | Freq: Four times a day (QID) | ORAL | Status: DC | PRN

## 2024-06-15 MED ORDER — METRONIDAZOLE 500 MG/100ML IV SOLN
500.0000 mg | Freq: Once | INTRAVENOUS | Status: AC
  Administered 2024-06-15: 500 mg via INTRAVENOUS
  Filled 2024-06-15: qty 100

## 2024-06-15 MED ORDER — MORPHINE SULFATE (PF) 4 MG/ML IV SOLN
4.0000 mg | Freq: Once | INTRAVENOUS | Status: AC
  Administered 2024-06-15: 4 mg via INTRAVENOUS
  Filled 2024-06-15: qty 1

## 2024-06-15 MED ORDER — POLYETHYLENE GLYCOL 3350 17 G PO PACK: 17.0000 g | PACK | Freq: Every day | ORAL | Status: DC | PRN

## 2024-06-15 MED ORDER — ACETAMINOPHEN 650 MG RE SUPP: 650.0000 mg | Freq: Four times a day (QID) | RECTAL | Status: DC | PRN

## 2024-06-15 MED ORDER — ENOXAPARIN SODIUM 40 MG/0.4ML IJ SOSY
40.0000 mg | PREFILLED_SYRINGE | INTRAMUSCULAR | Status: DC
  Administered 2024-06-16 – 2024-06-18 (×3): 40 mg via SUBCUTANEOUS
  Filled 2024-06-15 (×3): qty 0.4

## 2024-06-15 MED ORDER — SODIUM CHLORIDE 0.9 % IV SOLN
2.0000 g | INTRAVENOUS | Status: DC
  Administered 2024-06-16 – 2024-06-17 (×2): 2 g via INTRAVENOUS
  Filled 2024-06-15 (×2): qty 20

## 2024-06-15 NOTE — ED Provider Notes (Signed)
 [facility] AT [facility] Provider Note   CSN: [id number] Arrival date & time: 06/15/24  1156     Patient presents with: Abdominal Pain   Carmen Ramos is a 48 y.o. female with a past medical history of asthma, HTN, upper GIB, GERD, diverticulosis, hiatal hernia, blood transfusion presents Emergency Department for evaluation of abdominal pain, nausea, diarrhea that started 3 days ago.  Pain is constantly at a 5/10 with flares up to 8/10 in intensity.  Pain does not worsen following eating.  No vomiting but has been having diarrhea 4 times a day for the past 3 days.  Is passing gas.  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  She has had this pain in the past 5 years and 8 years ago when she has had a diverticular flare and this feels similar.  Abdominal surgeries include hysterectomy approximately in 2016, C-section with tubal ligation in 2013.  Denies fever, chills, urinary sx, vaginal sx    Abdominal Pain      Prior to Admission medications  Medication Sig Start Date End Date Taking? Authorizing Provider  acetaminophen  (TYLENOL ) 325 MG tablet Take 650 mg by mouth every 6 (six) hours as needed for moderate pain.    [provider]  carboxymethylcellulose (REFRESH PLUS) 0.5 % SOLN Place 1 drop into both eyes 3 (three) times daily as needed (dry eyes).    [provider]  cetirizine (ZYRTEC) 10 MG tablet Take 10 mg by mouth daily as needed for allergies.    [provider]  citalopram  (CELEXA ) 20 MG tablet Take 30 mg by mouth daily. 08/25/19   [provider]  Cyanocobalamin  (VITAMIN B-12 PO) Take 1 tablet by mouth daily.    [provider]  lisinopril -hydrochlorothiazide  (PRINZIDE ,ZESTORETIC ) 20-25 MG tablet Take 1 tablet by mouth daily.    [provider]  metoprolol  succinate (TOPROL -XL) 25 MG 24 hr tablet Take 25 mg by  mouth daily. 09/01/19   [provider]  oxyCODONE -acetaminophen  (PERCOCET/ROXICET) 5-325 MG tablet Take 1 tablet by mouth every 6 (six) hours as needed for severe pain. 11/13/20   Geiple, Joshua, PA-C  pantoprazole  (PROTONIX ) 40 MG tablet Take 1 tablet (40 mg total) by mouth 2 (two) times daily before a meal. 09/14/19   Rai, Ripudeep K, MD  sucralfate  (CARAFATE ) 1 g tablet Take 1 tablet (1 g total) by mouth 4 (four) times daily -  with meals and at bedtime. Patient taking differently: Take 1 g by mouth 2 (two) times daily. 11/07/19   Lorriane Holmes, MD    Allergies: Ferumoxytol , Gluten meal, and Latex    Review of Systems  Gastrointestinal:  Positive for abdominal pain.    Updated Vital Signs BP (!) 146/81   Pulse 79   Temp 98.9 F (37.2 C) (Oral)   Resp 20   SpO2 90%   Physical Exam Vitals and nursing note reviewed.  Constitutional:      General: She is not in acute distress.    Appearance: Normal appearance. She is obese.  HENT:     Head: Normocephalic and atraumatic.  Eyes:     Conjunctiva/sclera: Conjunctivae normal.  Cardiovascular:     Rate and Rhythm: Normal rate.  Pulmonary:     Effort: Pulmonary effort is normal. No respiratory distress.  Abdominal:     Tenderness: There is generalized abdominal tenderness.     Comments: Generalized tenderness worse  in LLQ, RLQ.  Voluntarily guarding.  No peritoneal signs.  Skin:    Coloration: Skin is not jaundiced or pale.  Neurological:     Mental Status: She is alert. Mental status is at baseline.     (all labs ordered are listed, but only abnormal results are displayed) Labs Reviewed  COMPREHENSIVE METABOLIC PANEL WITH GFR - Abnormal; Notable for the following components:      Result Value   Potassium 3.2 (*)    Chloride 95 (*)    Glucose, Bld 190 (*)    Total Protein 8.5 (*)    All other components within normal limits  CBC - Abnormal; Notable for the following components:   WBC 12.4 (*)    All other  components within normal limits  URINALYSIS, ROUTINE W REFLEX MICROSCOPIC - Abnormal; Notable for the following components:   Color, Urine STRAW (*)    APPearance HAZY (*)    All other components within normal limits  LIPASE, BLOOD  I-STAT CG4 LACTIC ACID, ED    EKG: None  Radiology: CT ABDOMEN PELVIS W CONTRAST Result Date: 06/15/2024 CLINICAL DATA:  Diverticulitis, complication suspected EXAM: CT ABDOMEN AND PELVIS WITH CONTRAST TECHNIQUE: Multidetector CT imaging of the abdomen and pelvis was performed using the standard protocol following bolus administration of intravenous contrast. RADIATION DOSE REDUCTION: This exam was performed according to the departmental dose-optimization program which includes automated exposure control, adjustment of the mA and/or kV according to patient size and/or use of iterative reconstruction technique. CONTRAST:  OMNIPAQUE  IOHEXOL  300 MG/ML  SOLN COMPARISON:  Nov 13, 2020 FINDINGS: Lower chest: LEFT basilar atelectasis. Hepatobiliary: Profound hepatic steatosis. Focal fatty sparing along the gallbladder fossa and falciform gallbladder is unremarkable. No extrahepatic biliary ductal dilation. Hepatomegaly. Pancreas: Unremarkable. No pancreatic ductal dilatation or surrounding inflammatory changes. Spleen: Normal in size without focal abnormality. Adrenals/Urinary Tract: Adrenal glands are unremarkable. Kidneys are normal, without renal calculi, focal lesion, or hydronephrosis. Bladder is unremarkable. Stomach/Bowel: No evidence of bowel obstruction. In the sigmoid colon, there is a prominent diverticula as well as circumferential, wall thickening with exuberant adjacent fat stranding. No definitive free air or focal drainable fluid collection. Appendix is normal. Large hiatal hernia. Vascular/Lymphatic: No significant vascular findings are present. No enlarged abdominal or pelvic lymph nodes. Reproductive: Status post hysterectomy. No adnexal masses. Other:  Fat containing periumbilical hernia. Musculoskeletal: Facet arthropathy of the lumbar spine. IMPRESSION: 1. Acute uncomplicated sigmoid diverticulitis. 2. Profound hepatic steatosis with hepatomegaly. 3. Large hiatal hernia. Electronically Signed   By: Corean Salter M.D.   On: 06/15/2024 17:59     Medications Ordered in the ED  cefTRIAXone  (ROCEPHIN ) 2 g in sodium chloride  0.9 % 100 mL IVPB (2 g Intravenous New Bag/Given 06/15/24 1816)    And  metroNIDAZOLE  (FLAGYL ) IVPB 500 mg (has no administration in time range)  ondansetron  (ZOFRAN ) injection 4 mg (has no administration in time range)  HYDROmorphone  (DILAUDID ) injection 1 mg (has no administration in time range)  morphine  (PF) 4 MG/ML injection 4 mg (4 mg Intravenous Given 06/15/24 1604)  ondansetron  (ZOFRAN ) injection 4 mg (4 mg Intravenous Given 06/15/24 1604)  potassium chloride  SA (KLOR-CON  M) CR tablet 40 mEq (40 mEq Oral Given 06/15/24 1547)  iohexol  (OMNIPAQUE ) 300 MG/ML solution 100 mL (100 mLs Intravenous Contrast Given 06/15/24 1718)  LORazepam  (ATIVAN ) tablet 1 mg (1 mg Oral Given 06/15/24 1640)  morphine  (PF) 4 MG/ML injection 4 mg (4 mg Intravenous Given 06/15/24 1813)  Medical Decision Making Amount and/or Complexity of Data Reviewed Labs: ordered. Radiology: ordered.  Risk Prescription drug management. Decision regarding hospitalization.   Patient presents to the ED for concern of abdominal pain, nausea, diarrhea, this involves an extensive number of treatment options, and is a complaint that carries with it a high risk of complications and morbidity.  The differential diagnosis includes gastroenteritis, obstruction, perforation, pancreatitis, diverticulitis, UTI, pyelonephritis   Co morbidities that complicate the patient evaluation  History of diverticulitis.  See HPI   Additional history obtained:  Additional history obtained from Nursing and Outside Medical Records    External records from outside source obtained and reviewed including triage RN note, ED note from 07/03/2027 for acute uncomplicated diverticulitis   Lab Tests:  I Ordered, and personally interpreted labs.  The pertinent results include:   WBC 12.4 UA without hCG nor infection Potassium 3.2  Lipase WNL Lactic WNL   Imaging Studies ordered:  I ordered imaging studies including CT abd pelvis  I independently visualized and interpreted imaging which showed acute uncomplicated diverticulitis I agree with the radiologist interpretation    Medicines ordered and prescription drug management:  I ordered medication including morphine , Zofran , potassium for pain, nausea, mild hypokalemia Reevaluation of the patient after these medicines showed that the patient improved I have reviewed the patients home medicines and have made adjustments as needed    Consultations Obtained:  I requested consultation with hospitalist,  and discussed lab and imaging findings as well as pertinent plan - Dr. Pearlean accepts patient for admission   Problem List / ED Course:  Abdominal pain Vital signs hemodynamically stable with no fever or tachycardia Generalized with voluntary guarding.  No peritoneal signs. Yells out in pain when there is movement or palpation of abdomen Reports that this feels similar to diverticular flares in the past.  Provided morphine  for pain, Zofran  for nausea.   Mild leukocytosis of 12.4 Lactic acid WNL.  No postprandial pain.  Low suspicion of mesenteric ischemia CT imaging notable for acute uncomplicated diverticulitis Does pass p.o. challenge with water.  Has not eaten anything. Provided Rocephin , Metro IV for diverticulitis in ED On reassessment, patient continues to endorse abdominal pain, nausea.  I provided her with morphine  x 2, Dilaudid  1 mg with some improvement.  She continues to yell out with any movement of her abdomen.  I also gave her an additional dose of  Zofran  for nausea Will admit patient for intractable pain   Reevaluation:  After the interventions noted above, I reevaluated the patient and found that they have :improved    Dispostion:  After consideration of the diagnostic results and the patients response to treatment, I feel that the patent would benefit from admission for intractable pain, diverticulitis.   Discussed ED workup, dispo patient expressed understand agrees with plan.  All questions answered to her satisfaction.  Final diagnoses:  Diverticulitis of sigmoid colon  Intractable pain    ED Discharge Orders     None        Carmen Tinnie BRAVO, PA 06/15/24 1910    Dasie Faden, MD 06/17/24 1635

## 2024-06-15 NOTE — H&P (Signed)
 History and Physical    SONDRA BLIXT FMW:[id number] DOB: August 06, 1975 DOA: 06/15/2024  PCP: [place], [place]  E, PA-C   Patient coming from: Home  I have personally briefly reviewed patient's old medical records in [facility]  Chief Complaint: Abdominal Pain  HPI: Carmen Ramos is a 48 y.o. female with medical history significant for hypertension, asthma, GI bleed, obesity. Patient presented to the ED with complaints of abdominal pain that started about 3 days ago.  It is mostly in the lower abdomen.  She denies urinary symptoms.  No vomiting, but she has had 4 loose stools daily for the past 3 days.  No fevers no chills.  She has had previous episodes of diverticulitis and this was similar to the prior.  ED Course: Temperature 98.  Respiratory rate 16-20.  Heart rate 79-94.  Blood pressure systolic 134-185.  O2 sat greater 90% on room air. WBC 12.4.  Potassium 3.2. CTAP WC-acute uncomplicated diverticulitis. IV ceftriaxone  and metronidazole  given.  4 mg morphine  x 2 and 1 mg Dilaudid  given.  Patient still with uncontrolled pain.  Hospitalist to admit.  Review of Systems: As per HPI all other systems reviewed and negative.  Past Medical History:  Diagnosis Date   Anemia    Asthma    Bleeding ulcer    Blood transfusion without reported diagnosis    6   Hiatal hernia    Hypertension     Past Surgical History:  Procedure Laterality Date   ABDOMINAL HYSTERECTOMY     BIOPSY  09/13/2019   Procedure: BIOPSY;  Surgeon: Shila Gustav GAILS, MD;  Location: [facility];  Service: Endoscopy;;   CESAREAN SECTION     ESOPHAGOGASTRODUODENOSCOPY (EGD) WITH PROPOFOL  N/A 09/13/2019   Procedure: ESOPHAGOGASTRODUODENOSCOPY (EGD) WITH PROPOFOL ;  Surgeon: Shila Gustav GAILS, MD;  Location: [facility];  Service: Endoscopy;  Laterality: N/A;   TUBAL LIGATION       reports that she has been smoking cigars. She has never used smokeless tobacco. She reports that she does not drink  alcohol  and does not use drugs.  Allergies[1]  Family History  Problem Relation Age of Onset   Cancer Mother    Parkinson's disease Father    Dementia Father     Prior to Admission medications  Medication Sig Start Date End Date Taking? Authorizing Provider  acetaminophen  (TYLENOL ) 325 MG tablet Take 650 mg by mouth every 6 (six) hours as needed for moderate pain.    [provider]  carboxymethylcellulose (REFRESH PLUS) 0.5 % SOLN Place 1 drop into both eyes 3 (three) times daily as needed (dry eyes).    [provider]  cetirizine (ZYRTEC) 10 MG tablet Take 10 mg by mouth daily as needed for allergies.    [provider]  citalopram  (CELEXA ) 20 MG tablet Take 30 mg by mouth daily. 08/25/19   [provider]  Cyanocobalamin  (VITAMIN B-12 PO) Take 1 tablet by mouth daily.    [provider]  lisinopril -hydrochlorothiazide  (PRINZIDE ,ZESTORETIC ) 20-25 MG tablet Take 1 tablet by mouth daily.    [provider]  metoprolol  succinate (TOPROL -XL) 25 MG 24 hr tablet Take 25 mg by mouth daily. 09/01/19   [provider]  oxyCODONE -acetaminophen  (PERCOCET/ROXICET) 5-325 MG tablet Take 1 tablet by mouth every 6 (six) hours as needed for severe pain. 11/13/20   Geiple, Joshua, PA-C  pantoprazole  (PROTONIX ) 40 MG tablet Take 1 tablet (40 mg total) by mouth 2 (two) times daily before a meal. 09/14/19   Rai,  Ripudeep K, MD  sucralfate  (CARAFATE ) 1 g tablet Take 1 tablet (1 g total) by mouth 4 (four) times daily -  with meals and at bedtime. Patient taking differently: Take 1 g by mouth 2 (two) times daily. 11/07/19   Lorriane Holmes, MD    Physical Exam: Vitals:   06/15/24 1203 06/15/24 1340 06/15/24 1630 06/15/24 1820  BP: (!) 185/95 133/67 (!) 146/81   Pulse: 94 83 79   Resp: 16 17 20    Temp: 98 F (36.7 C) 98.6 F (37 C)  98.9 F (37.2 C)  TempSrc: Oral Oral  Oral  SpO2: 95% 93% 90%     Constitutional: NAD, calm, comfortable Vitals:    06/15/24 1203 06/15/24 1340 06/15/24 1630 06/15/24 1820  BP: (!) 185/95 133/67 (!) 146/81   Pulse: 94 83 79   Resp: 16 17 20    Temp: 98 F (36.7 C) 98.6 F (37 C)  98.9 F (37.2 C)  TempSrc: Oral Oral  Oral  SpO2: 95% 93% 90%    Eyes: PERRL, lids and conjunctivae normal ENMT: Mucous membranes are dry   Neck: normal, supple, no masses, no thyromegaly Respiratory:Normal respiratory effort. No accessory muscle use.  Cardiovascular: Regular rate and rhythm, No extremity edema.  Extremities warm. \Abdomen: Moderate lower abdominal tenderness, no masses palpated. No hepatosplenomegaly.  Musculoskeletal: no clubbing / cyanosis. No joint deformity upper and lower extremities.  Skin: no rashes, lesions, ulcers. No induration Neurologic: No facial asymmetry, will extremity spontaneously, speech fluent.  Psychiatric: Normal judgment and insight. Alert and oriented x 3. Normal mood.   Labs on Admission: I have personally reviewed following labs and imaging studies  CBC: Recent Labs  Lab 06/15/24 1308  WBC 12.4*  HGB 12.2  HCT 38.8  MCV 89.0  PLT 271   Basic Metabolic Panel: Recent Labs  Lab 06/15/24 1308  NA 136  K 3.2*  CL 95*  CO2 29  GLUCOSE 190*  BUN 10  CREATININE 0.72  CALCIUM 9.5   GFR: CrCl cannot be calculated (Unknown ideal weight.). Liver Function Tests: Recent Labs  Lab 06/15/24 1308  AST 29  ALT 31  ALKPHOS 84  BILITOT 0.3  PROT 8.5*  ALBUMIN 4.2   Recent Labs  Lab 06/15/24 1308  LIPASE 28   Urine analysis:    Component Value Date/Time   COLORURINE STRAW (A) 06/15/2024 1440   APPEARANCEUR HAZY (A) 06/15/2024 1440   LABSPEC 1.011 06/15/2024 1440   PHURINE 5.0 06/15/2024 1440   GLUCOSEU NEGATIVE 06/15/2024 1440   HGBUR NEGATIVE 06/15/2024 1440   BILIRUBINUR NEGATIVE 06/15/2024 1440   KETONESUR NEGATIVE 06/15/2024 1440   PROTEINUR NEGATIVE 06/15/2024 1440   NITRITE NEGATIVE 06/15/2024 1440   LEUKOCYTESUR NEGATIVE 06/15/2024 1440     Radiological Exams on Admission: CT ABDOMEN PELVIS W CONTRAST Result Date: 06/15/2024 CLINICAL DATA:  Diverticulitis, complication suspected EXAM: CT ABDOMEN AND PELVIS WITH CONTRAST TECHNIQUE: Multidetector CT imaging of the abdomen and pelvis was performed using the standard protocol following bolus administration of intravenous contrast. RADIATION DOSE REDUCTION: This exam was performed according to the departmental dose-optimization program which includes automated exposure control, adjustment of the mA and/or kV according to patient size and/or use of iterative reconstruction technique. CONTRAST:  OMNIPAQUE  IOHEXOL  300 MG/ML  SOLN COMPARISON:  Nov 13, 2020 FINDINGS: Lower chest: LEFT basilar atelectasis. Hepatobiliary: Profound hepatic steatosis. Focal fatty sparing along the gallbladder fossa and falciform gallbladder is unremarkable. No extrahepatic biliary ductal dilation. Hepatomegaly. Pancreas: Unremarkable. No pancreatic ductal dilatation or  surrounding inflammatory changes. Spleen: Normal in size without focal abnormality. Adrenals/Urinary Tract: Adrenal glands are unremarkable. Kidneys are normal, without renal calculi, focal lesion, or hydronephrosis. Bladder is unremarkable. Stomach/Bowel: No evidence of bowel obstruction. In the sigmoid colon, there is a prominent diverticula as well as circumferential, wall thickening with exuberant adjacent fat stranding. No definitive free air or focal drainable fluid collection. Appendix is normal. Large hiatal hernia. Vascular/Lymphatic: No significant vascular findings are present. No enlarged abdominal or pelvic lymph nodes. Reproductive: Status post hysterectomy. No adnexal masses. Other: Fat containing periumbilical hernia. Musculoskeletal: Facet arthropathy of the lumbar spine. IMPRESSION: 1. Acute uncomplicated sigmoid diverticulitis. 2. Profound hepatic steatosis with hepatomegaly. 3. Large hiatal hernia. Electronically Signed   By:  Corean Salter M.D.   On: 06/15/2024 17:59    EKG: None  Assessment/Plan Principal Problem:   Acute diverticulitis Active Problems:   HTN (hypertension)   Asthma, chronic, unspecified asthma severity, with acute exacerbation   Anxiety   Assessment and Plan:  Acute diverticulitis-abdominal pain and diarrhea.  Afebrile.  WBC 12.4.  CTAP WC acute uncomplicated diverticulitis, status post hepatomegaly. - Bowel rest with clear liquid diet - IV morphine  4 mg every 4 hourly as needed - IV ceftriaxone  and metronidazole  - N/s + 40 Kcl 100cc/hr x 20hrs  Hypokalemia-potassium 3.2.  He is on lisinopril  HCTZ. - Replete - Check magnesium  Hypertension-  - Resume metoprolol , lisinopril  HCTZ  Asthma-stable.  Anxiety -Resume Celexa    DVT prophylaxis: Lovenox  Code Status: FULL Family Communication: None at bedside Disposition Plan: ~ 2 days Consults called: None Admission status: Inpt Tele  I certify that at the point of admission it is my clinical judgment that the patient will require inpatient hospital care spanning beyond 2 midnights from the point of admission due to high intensity of service, high risk for further deterioration and high frequency of surveillance required.   Author: Tully FORBES Carwin, MD 06/15/2024 8:04 PM  For on call review [email or web address].      [1]  Allergies Allergen Reactions   Ferumoxytol  Itching   Gluten Meal     Arthritis, IBS   Latex Rash

## 2024-06-15 NOTE — ED Triage Notes (Signed)
 Pt goes by Carmen Ramos  Pt ambulatory to triage with complaints of abdominal pain that began on Friday, and has worsened. PT describes the pain as feeling like menstrual cramps, but states her uterus, cervix, and fallopian tubes have been removed. Ovaries remain. Denies urinary complaints, denies constipation.

## 2024-06-16 LAB — CBC
HCT: 38 % (ref 36.0–46.0)
Hemoglobin: 11.5 g/dL — ABNORMAL LOW (ref 12.0–15.0)
MCH: 27.5 pg (ref 26.0–34.0)
MCHC: 30.3 g/dL (ref 30.0–36.0)
MCV: 90.9 fL (ref 80.0–100.0)
Platelets: 246 K/uL (ref 150–400)
RBC: 4.18 MIL/uL (ref 3.87–5.11)
RDW: 14.9 % (ref 11.5–15.5)
WBC: 14.7 K/uL — ABNORMAL HIGH (ref 4.0–10.5)
nRBC: 0 % (ref 0.0–0.2)

## 2024-06-16 LAB — BASIC METABOLIC PANEL WITH GFR
Anion gap: 10 (ref 5–15)
BUN: 12 mg/dL (ref 6–20)
CO2: 28 mmol/L (ref 22–32)
Calcium: 9.1 mg/dL (ref 8.9–10.3)
Chloride: 97 mmol/L — ABNORMAL LOW (ref 98–111)
Creatinine, Ser: 0.96 mg/dL (ref 0.44–1.00)
GFR, Estimated: >60 mL/min (ref >60)
Glucose, Bld: 124 mg/dL — ABNORMAL HIGH (ref 70–99)
Potassium: 4.8 mmol/L (ref 3.5–5.1)
Sodium: 136 mmol/L (ref 135–145)

## 2024-06-16 LAB — HIV ANTIBODY (ROUTINE TESTING W REFLEX): HIV Screen 4th Generation wRfx: NONREACTIVE

## 2024-06-16 MED ORDER — MIRTAZAPINE 15 MG PO TABS
15.0000 mg | ORAL_TABLET | Freq: Every day | ORAL | Status: DC
  Administered 2024-06-16 – 2024-06-17 (×2): 15 mg via ORAL
  Filled 2024-06-16 (×2): qty 1

## 2024-06-16 MED ORDER — OXYCODONE HCL 5 MG PO TABS
5.0000 mg | ORAL_TABLET | Freq: Four times a day (QID) | ORAL | Status: DC | PRN
  Administered 2024-06-16 – 2024-06-18 (×3): 5 mg via ORAL
  Filled 2024-06-16 (×3): qty 1

## 2024-06-16 MED ORDER — METOPROLOL SUCCINATE ER 25 MG PO TB24
25.0000 mg | ORAL_TABLET | Freq: Every day | ORAL | Status: DC
  Administered 2024-06-16 – 2024-06-17 (×2): 25 mg via ORAL
  Filled 2024-06-16 (×2): qty 1

## 2024-06-16 MED ORDER — LACTATED RINGERS IV SOLN: INTRAVENOUS | Status: AC

## 2024-06-16 MED ORDER — MORPHINE SULFATE (PF) 4 MG/ML IV SOLN
4.0000 mg | INTRAVENOUS | Status: DC | PRN
  Administered 2024-06-16 – 2024-06-18 (×5): 4 mg via INTRAVENOUS
  Filled 2024-06-16 (×5): qty 1

## 2024-06-16 MED ORDER — PANTOPRAZOLE SODIUM 40 MG PO TBEC
40.0000 mg | DELAYED_RELEASE_TABLET | Freq: Two times a day (BID) | ORAL | Status: DC
  Administered 2024-06-16 – 2024-06-18 (×4): 40 mg via ORAL
  Filled 2024-06-16 (×4): qty 1

## 2024-06-16 MED ORDER — CITALOPRAM HYDROBROMIDE 20 MG PO TABS
30.0000 mg | ORAL_TABLET | Freq: Every day | ORAL | Status: DC
  Administered 2024-06-16 – 2024-06-18 (×3): 30 mg via ORAL
  Filled 2024-06-16 (×3): qty 2

## 2024-06-16 NOTE — Hospital Course (Signed)
 Carmen Ramos is a 48 y.o. female with medical history significant for hypertension, asthma, GI bleed, obesity. Patient presented to the ED with complaints of abdominal pain that started about 3 days ago.  It is mostly in the lower abdomen. Being admitted to the Hospital for Recurrent Diverticulitis  She has had previous episodes of diverticulitis and this was similar to the prior.  Assessment and Plan:  Acute Diverticulitis: Abdominal pain and diarrhea.  Afebrile.  WBC 12.4.  CTAP WC acute uncomplicated diverticulitis, status post hepatomegaly. -Bowel rest with clear liquid diet -> Full Liquid -IV morphine  4 mg every 4 hourly as needed changed to q3hprn and added po Oxycodone  IR 5 mg po q6h -IV Ceftriaxone  and Metronidazole  -N/s + 40 Kcl 100cc/hr x 20hrs now complete and will renew IVF w/ LR @ 75 mL/hr -Supportive Care and C/w po/IV Zofran   -C/w Miralax  Daily PRN -WBC went from 12.4 -> 14.7    Hypokalemia: Potassium 3.2 and improved to 4.8.  SHe is on Lisinopril -Hydrochlorothiazide  which will be held. Stop IVF containing KCL and start LR as above   Essential Hypertension: Resume Metoprolol ; Hold Lisinopril -hydrochlorothiazide . CTM BP per Protocol Last BP reading was 143/64   Asthma: Stable. CTM Respiratory Status   Anxiety and Depression: Resume Citalopram  30 mg po Daily  Normocytic Anemia: Hgb/Hct Trend:  Recent Labs  Lab 06/15/24 1308 06/16/24 0446  HGB 12.2 11.5*  HCT 38.8 38.0  MCV 89.0 90.9  -Check Anemia Panel in the AM; CTM for S/Sx of Bleeding; No overt bleeding noted. Repeat CBC in the AM  GERD/GI Prophylaxis / Hx of GIB: Resume PPI w/ Pantoprazole  40 mg BID  Class III (Morbid) Obesity: Complicates overall prognosis and care. Estimated body mass index is 43.62 kg/m as calculated from the following:   Height as of 09/12/19: 5' 3 (1.6 m).   Weight as of 09/12/19: 111.7 kg. Weight Loss and Dietary Counseling given

## 2024-06-16 NOTE — Progress Notes (Signed)
 PROGRESS NOTE    Carmen Ramos  FMW:[id number] DOB: 1975/10/27 DOA: 06/15/2024 PCP: Bon[place], [place]  E, PA-C   Brief Narrative:  Carmen Ramos is a 48 y.o. female with medical history significant for hypertension, asthma, GI bleed, obesity. Patient presented to the ED with complaints of abdominal pain that started about 3 days ago.  It is mostly in the lower abdomen. Being admitted to the Hospital for Recurrent Diverticulitis  She has had previous episodes of diverticulitis and this was similar to the prior.  Assessment and Plan:  Acute Diverticulitis: Abdominal pain and diarrhea.  Afebrile.  WBC 12.4.  CTAP WC acute uncomplicated diverticulitis, status post hepatomegaly. -Bowel rest with clear liquid diet -> Full Liquid -IV morphine  4 mg every 4 hourly as needed changed to q3hprn and added po Oxycodone  IR 5 mg po q6h -IV Ceftriaxone  and Metronidazole  -N/s + 40 Kcl 100cc/hr x 20hrs now complete and will renew IVF w/ LR @ 75 mL/hr -Supportive Care and C/w po/IV Zofran   -C/w Miralax  Daily PRN -WBC went from 12.4 -> 14.7    Hypokalemia: Potassium 3.2 and improved to 4.8.  SHe is on Lisinopril -Hydrochlorothiazide  which will be held. Stop IVF containing KCL and start LR as above   Essential Hypertension: Resume Metoprolol ; Hold Lisinopril -hydrochlorothiazide . CTM BP per Protocol Last BP reading was 143/64   Asthma: Stable. CTM Respiratory Status   Anxiety and Depression: Resume Citalopram  30 mg po Daily  Normocytic Anemia: Hgb/Hct Trend:  Recent Labs  Lab 06/15/24 1308 06/16/24 0446  HGB 12.2 11.5*  HCT 38.8 38.0  MCV 89.0 90.9  -Check Anemia Panel in the AM; CTM for S/Sx of Bleeding; No overt bleeding noted. Repeat CBC in the AM  GERD/GI Prophylaxis / Hx of GIB: Resume PPI w/ Pantoprazole  40 mg BID  Class III (Morbid) Obesity: Complicates overall prognosis and care. Estimated body mass index is 43.62 kg/m as calculated from the following:   Height as of 09/12/19: 5' 3  (1.6 m).   Weight as of 09/12/19: 111.7 kg. Weight Loss and Dietary Counseling given   DVT prophylaxis: enoxaparin  (LOVENOX ) injection 40 mg Start: 06/16/24 1000    Code Status: Full Code Family Communication: No family present @ bedside   Disposition Plan:  Level of care: Telemetry Status is: Inpatient Remains inpatient appropriate because: Needs further clinical improvement in her Sx and tolerance of Diet   Consultants:  None  Procedures:  As delineated as above  Antimicrobials:  Anti-infectives (From admission, onward)    Start     Dose/Rate Route Frequency Ordered Stop   06/16/24 1800  cefTRIAXone  (ROCEPHIN ) 2 g in sodium chloride  0.9 % 100 mL IVPB        2 g 200 mL/hr over 30 Minutes Intravenous Every 24 hours 06/15/24 1935     06/16/24 0800  metroNIDAZOLE  (FLAGYL ) IVPB 500 mg        500 mg 100 mL/hr over 60 Minutes Intravenous Every 12 hours 06/15/24 1935     06/15/24 1815  cefTRIAXone  (ROCEPHIN ) 2 g in sodium chloride  0.9 % 100 mL IVPB       Placed in And Linked Group   2 g 200 mL/hr over 30 Minutes Intravenous  Once 06/15/24 1812 06/16/24 0829   06/15/24 1815  metroNIDAZOLE  (FLAGYL ) IVPB 500 mg       Placed in And Linked Group   500 mg 100 mL/hr over 60 Minutes Intravenous  Once 06/15/24 1812 06/16/24 0829       Subjective: Seen and  examined at bedside and was still having some abdominal discomfort and pain.  Says that this is her third time with diverticulitis.  No lightheadedness or dizziness.  Does not feel well today.  No other concerns or complaints at this time.  Objective: Vitals:   06/15/24 2329 06/16/24 0139 06/16/24 0520 06/16/24 1016  BP:  124/62 139/74 (!) 143/64  Pulse:  72 87 78  Resp: 18 20 20 19   Temp:  98.7 F (37.1 C) 98.1 F (36.7 C) 98.9 F (37.2 C)  TempSrc:  Oral Oral Oral  SpO2:  96% 96%     Intake/Output Summary (Last 24 hours) at 06/16/2024 2006 Last data filed at 06/16/2024 1050 Gross per 24 hour  Intake 240 ml  Output  --  Net 240 ml   There were no vitals filed for this visit.  Examination: Physical Exam:  Constitutional: WN/WD morbidly obese Caucasian female who appears a little uncomfortable Respiratory: Diminished to auscultation bilaterally, no wheezing, rales, rhonchi or crackles. Normal respiratory effort and patient is not tachypenic. No accessory muscle use.  Unlabored breathing Cardiovascular: RRR, no murmurs / rubs / gallops. S1 and S2 auscultated.  Mild extremity edema Abdomen: Soft, tender to palpate and distended secondary to body habitus. Bowel sounds positive.  GU: Deferred. Musculoskeletal: No clubbing / cyanosis of digits/nails. No joint deformity upper and lower extremities.  Skin: No rashes, lesions, ulcers has multiple tattoos and piercings noted throughout her body. No induration; Warm and dry.  Neurologic: CN 2-12 grossly intact with no focal deficits. Romberg sign and cerebellar reflexes not assessed.  Psychiatric: Normal judgment and insight. Alert and oriented x 3. Normal mood and appropriate affect.   Data Reviewed: I have personally reviewed following labs and imaging studies  CBC: Recent Labs  Lab 06/15/24 1308 06/16/24 0446  WBC 12.4* 14.7*  HGB 12.2 11.5*  HCT 38.8 38.0  MCV 89.0 90.9  PLT 271 246   Basic Metabolic Panel: Recent Labs  Lab 06/15/24 1308 06/15/24 1918 06/16/24 0446  NA 136  --  136  K 3.2*  --  4.8  CL 95*  --  97*  CO2 29  --  28  GLUCOSE 190*  --  124*  BUN 10  --  12  CREATININE 0.72  --  0.96  CALCIUM 9.5  --  9.1  MG  --  2.0  --    GFR: CrCl cannot be calculated (Unknown ideal weight.). Liver Function Tests: Recent Labs  Lab 06/15/24 1308  AST 29  ALT 31  ALKPHOS 84  BILITOT 0.3  PROT 8.5*  ALBUMIN 4.2   Recent Labs  Lab 06/15/24 1308  LIPASE 28   No results for input(s): AMMONIA in the last 168 hours. Coagulation Profile: No results for input(s): INR, PROTIME in the last 168 hours. Cardiac Enzymes: No  results for input(s): CKTOTAL, CKMB, CKMBINDEX, TROPONINI in the last 168 hours. BNP (last 3 results) No results for input(s): PROBNP in the last 8760 hours. HbA1C: No results for input(s): HGBA1C in the last 72 hours. CBG: No results for input(s): GLUCAP in the last 168 hours. Lipid Profile: No results for input(s): CHOL, HDL, LDLCALC, TRIG, CHOLHDL, LDLDIRECT in the last 72 hours. Thyroid  Function Tests: No results for input(s): TSH, T4TOTAL, FREET4, T3FREE, THYROIDAB in the last 72 hours. Anemia Panel: No results for input(s): VITAMINB12, FOLATE, FERRITIN, TIBC, IRON, RETICCTPCT in the last 72 hours. Sepsis Labs: Recent Labs  Lab 06/15/24 1633  LATICACIDVEN 1.1   No results  found for this or any previous visit (from the past 240 hours).   Radiology Studies: CT ABDOMEN PELVIS W CONTRAST Result Date: 06/15/2024 CLINICAL DATA:  Diverticulitis, complication suspected EXAM: CT ABDOMEN AND PELVIS WITH CONTRAST TECHNIQUE: Multidetector CT imaging of the abdomen and pelvis was performed using the standard protocol following bolus administration of intravenous contrast. RADIATION DOSE REDUCTION: This exam was performed according to the departmental dose-optimization program which includes automated exposure control, adjustment of the mA and/or kV according to patient size and/or use of iterative reconstruction technique. CONTRAST:  OMNIPAQUE  IOHEXOL  300 MG/ML  SOLN COMPARISON:  Nov 13, 2020 FINDINGS: Lower chest: LEFT basilar atelectasis. Hepatobiliary: Profound hepatic steatosis. Focal fatty sparing along the gallbladder fossa and falciform gallbladder is unremarkable. No extrahepatic biliary ductal dilation. Hepatomegaly. Pancreas: Unremarkable. No pancreatic ductal dilatation or surrounding inflammatory changes. Spleen: Normal in size without focal abnormality. Adrenals/Urinary Tract: Adrenal glands are unremarkable. Kidneys are normal,  without renal calculi, focal lesion, or hydronephrosis. Bladder is unremarkable. Stomach/Bowel: No evidence of bowel obstruction. In the sigmoid colon, there is a prominent diverticula as well as circumferential, wall thickening with exuberant adjacent fat stranding. No definitive free air or focal drainable fluid collection. Appendix is normal. Large hiatal hernia. Vascular/Lymphatic: No significant vascular findings are present. No enlarged abdominal or pelvic lymph nodes. Reproductive: Status post hysterectomy. No adnexal masses. Other: Fat containing periumbilical hernia. Musculoskeletal: Facet arthropathy of the lumbar spine. IMPRESSION: 1. Acute uncomplicated sigmoid diverticulitis. 2. Profound hepatic steatosis with hepatomegaly. 3. Large hiatal hernia. Electronically Signed   By: Corean Salter M.D.   On: 06/15/2024 17:59   Scheduled Meds:  citalopram   30 mg Oral Daily   enoxaparin  (LOVENOX ) injection  40 mg Subcutaneous Q24H   metoprolol  succinate  25 mg Oral QHS   mirtazapine   15 mg Oral QHS   pantoprazole   40 mg Oral BID   Continuous Infusions:  cefTRIAXone  (ROCEPHIN )  IV 2 g (06/16/24 1846)   lactated ringers  75 mL/hr at 06/16/24 0950   metronidazole  500 mg (06/16/24 0953)    LOS: 1 day   Alejandro Marker, DO [facility] Available via Epic secure chat 7am-7pm After these hours, please refer to coverage provider listed on [email or web address] 06/16/2024, 8:06 PM

## 2024-06-16 NOTE — Plan of Care (Signed)

## 2024-06-17 LAB — CBC WITH DIFFERENTIAL/PLATELET
Abs Immature Granulocytes: 0.06 K/uL (ref 0.00–0.07)
Basophils Absolute: 0 K/uL (ref 0.0–0.1)
Basophils Relative: 0 %
Eosinophils Absolute: 0.1 K/uL (ref 0.0–0.5)
Eosinophils Relative: 1 %
HCT: 34.1 % — ABNORMAL LOW (ref 36.0–46.0)
Hemoglobin: 10.4 g/dL — ABNORMAL LOW (ref 12.0–15.0)
Immature Granulocytes: 1 %
Lymphocytes Relative: 20 %
Lymphs Abs: 2.2 K/uL (ref 0.7–4.0)
MCH: 27.7 pg (ref 26.0–34.0)
MCHC: 30.5 g/dL (ref 30.0–36.0)
MCV: 90.7 fL (ref 80.0–100.0)
Monocytes Absolute: 0.9 K/uL (ref 0.1–1.0)
Monocytes Relative: 8 %
Neutro Abs: 7.8 K/uL — ABNORMAL HIGH (ref 1.7–7.7)
Neutrophils Relative %: 70 %
Platelets: 277 K/uL (ref 150–400)
RBC: 3.76 MIL/uL — ABNORMAL LOW (ref 3.87–5.11)
RDW: 14.7 % (ref 11.5–15.5)
WBC: 11 K/uL — ABNORMAL HIGH (ref 4.0–10.5)
nRBC: 0 % (ref 0.0–0.2)

## 2024-06-17 LAB — COMPREHENSIVE METABOLIC PANEL WITH GFR
ALT: 35 U/L (ref 0–44)
AST: 45 U/L — ABNORMAL HIGH (ref 15–41)
Albumin: 3.9 g/dL (ref 3.5–5.0)
Alkaline Phosphatase: 79 U/L (ref 38–126)
Anion gap: 10 (ref 5–15)
BUN: 10 mg/dL (ref 6–20)
CO2: 30 mmol/L (ref 22–32)
Calcium: 9.3 mg/dL (ref 8.9–10.3)
Chloride: 97 mmol/L — ABNORMAL LOW (ref 98–111)
Creatinine, Ser: 0.79 mg/dL (ref 0.44–1.00)
GFR, Estimated: >60 mL/min (ref >60)
Glucose, Bld: 112 mg/dL — ABNORMAL HIGH (ref 70–99)
Potassium: 4.1 mmol/L (ref 3.5–5.1)
Sodium: 136 mmol/L (ref 135–145)
Total Bilirubin: 0.2 mg/dL (ref 0.0–1.2)
Total Protein: 8.1 g/dL (ref 6.5–8.1)

## 2024-06-17 LAB — MAGNESIUM: Magnesium: 2.4 mg/dL (ref 1.7–2.4)

## 2024-06-17 LAB — PHOSPHORUS: Phosphorus: 3.5 mg/dL (ref 2.5–4.6)

## 2024-06-17 MED ORDER — PROCHLORPERAZINE EDISYLATE 10 MG/2ML IJ SOLN
10.0000 mg | Freq: Four times a day (QID) | INTRAMUSCULAR | Status: DC | PRN
  Administered 2024-06-18: 14:00:00 10 mg via INTRAVENOUS
  Filled 2024-06-17: qty 2

## 2024-06-17 MED ORDER — LACTATED RINGERS IV SOLN: INTRAVENOUS | Status: DC

## 2024-06-17 MED ORDER — PROCHLORPERAZINE EDISYLATE 10 MG/2ML IJ SOLN: 10.0000 mg | Freq: Once | INTRAMUSCULAR | Status: DC | PRN

## 2024-06-17 MED ORDER — PROCHLORPERAZINE EDISYLATE 10 MG/2ML IJ SOLN
5.0000 mg | Freq: Once | INTRAMUSCULAR | Status: AC | PRN
  Administered 2024-06-17: 03:00:00 5 mg via INTRAVENOUS
  Filled 2024-06-17: qty 2

## 2024-06-17 NOTE — Plan of Care (Signed)

## 2024-06-17 NOTE — Plan of Care (Signed)

## 2024-06-17 NOTE — Progress Notes (Signed)
 PROGRESS NOTE    Carmen Ramos  FMW:[id number] DOB: Feb 20, 1976 DOA: 06/15/2024 PCP: Boneta, [place]  E, PA-C   Brief Narrative:  Carmen Ramos is a 48 y.o. female with medical history significant for hypertension, asthma, GI bleed, obesity. Patient presented to the ED with complaints of abdominal pain that started about 3 days ago.  It is mostly in the lower abdomen. Being admitted to the Hospital for Recurrent Diverticulitis  She has had previous episodes of diverticulitis and this was similar to the prior. Slow to improve and still having Sx so will not advance past FULL Liquid Diet.   Assessment and Plan:  Acute Diverticulitis with associated Abdominal pain and diarrhea:  Afebrile.  CTAP WC acute uncomplicated diverticulitis, status post hepatomegaly. -Bowel rest initially but advanced to CLD -> Full Liquid; Still having Sx so will remain on FULL Liquid today and will try to advance to SOFT Diet tomorrow  -IV Morphine  4 mg every 4 hourly as needed changed to q3hprn and added po Oxycodone  IR 5 mg po q6h -IV Ceftriaxone  and Metronidazole  -Renew IVF w/ LR @ 75 mL/hr x 1 Day -Supportive Care and C/w po/IV Zofran  and add IV Compazine  10 mg q6hprn for Refractory N/V -C/w Miralax  Daily PRN -WBC went from 12.4 -> 14.7 -> 11.0 -Will need Surgical F/u in the outpatient setting once she recovers as this is the 3rd episode of Diverticulitis    Hypokalemia: Potassium is now improved to 4.1.  SHe is on Lisinopril -Hydrochlorothiazide  which will be held. Stopped IVF containing KCL and start LR as above   Essential Hypertension: Resume Metoprolol ; Hold Lisinopril -hydrochlorothiazide . CTM BP per Protocol Last BP reading was 128/65   Asthma: Stable. CTM Respiratory Status   Anxiety and Depression: Resume Citalopram  30 mg po Daily  Normocytic Anemia: Hgb/Hct Trend dropped and went from 12.2/38.8 -> 11.5/38.0 -> 10.4/34.1. Check Anemia Panel in the AM; CTM for S/Sx of Bleeding; No overt  bleeding noted. Repeat CBC in the AM  GERD/GI Prophylaxis / Hx of GIB: Resume PPI w/ Pantoprazole  40 mg BID  Class III (Morbid) Obesity: Complicates overall prognosis and care. Estimated body mass index is 43.62 kg/m as calculated from the following:   Height as of 09/12/19: 5' 3 (1.6 m).   Weight as of 09/12/19: 111.7 kg. Weight Loss and Dietary Counseling given   DVT prophylaxis: enoxaparin  (LOVENOX ) injection 40 mg Start: 06/16/24 1000    Code Status: Full Code Family Communication: No family present @ bedside   Disposition Plan:  Level of care: Telemetry Status is: Inpatient Remains inpatient appropriate because: Needs further clinical improvement and tolerance of her diet   Consultants:  None  Procedures:  As delineated above  Antimicrobials:  Anti-infectives (From admission, onward)    Start     Dose/Rate Route Frequency Ordered Stop   06/16/24 1800  cefTRIAXone  (ROCEPHIN ) 2 g in sodium chloride  0.9 % 100 mL IVPB        2 g 200 mL/hr over 30 Minutes Intravenous Every 24 hours 06/15/24 1935     06/16/24 0800  metroNIDAZOLE  (FLAGYL ) IVPB 500 mg        500 mg 100 mL/hr over 60 Minutes Intravenous Every 12 hours 06/15/24 1935     06/15/24 1815  cefTRIAXone  (ROCEPHIN ) 2 g in sodium chloride  0.9 % 100 mL IVPB       Placed in And Linked Group   2 g 200 mL/hr over 30 Minutes Intravenous  Once 06/15/24 1812 06/16/24 0829   06/15/24 1815  metroNIDAZOLE  (FLAGYL ) IVPB 500 mg       Placed in And Linked Group   500 mg 100 mL/hr over 60 Minutes Intravenous  Once 06/15/24 1812 06/16/24 0829       Subjective: Seen and examined at bedside and still having quite a bit of abdominal discomfort and having nausea.  Zofran  did not really help so we will add IV Compazine .  No chest pain or lightheadedness.  Feels okay but just very tired.  No other concerns or complaints at this time.  Objective: Vitals:   06/16/24 2109 06/17/24 0641 06/17/24 1205 06/17/24 1939  BP: (!) 158/70  136/65 124/65 128/65  Pulse: 77 69 63 62  Resp: 16 20  17   Temp: 99.2 F (37.3 C) 98.2 F (36.8 C) (!) 97.5 F (36.4 C) 97.8 F (36.6 C)  TempSrc:   Oral Oral  SpO2: 100%       Intake/Output Summary (Last 24 hours) at 06/17/2024 2043 Last data filed at 06/17/2024 9149 Gross per 24 hour  Intake --  Output 100 ml  Net -100 ml   There were no vitals filed for this visit.  Examination: Physical Exam:  Constitutional: WN/WD morbidly obese chronically ill-appearing Caucasian female who is a little uncomfortable Respiratory: Diminished to auscultation bilaterally, no wheezing, rales, rhonchi or crackles. Normal respiratory effort and patient is not tachypenic. No accessory muscle use.  Unlabored breathing Cardiovascular: RRR, no murmurs / rubs / gallops. S1 and S2 auscultated. No extremity edema.  Abdomen: Soft, tender to palpate, distended secondary body habitus. Bowel sounds positive.  GU: Deferred. Musculoskeletal: No clubbing / cyanosis of digits/nails. No joint deformity upper and lower extremities.  Skin: No rashes, lesions, ulcers on a limited skin evaluation but has multiple tattoos scattered diffusely throughout her on her body. No induration; Warm and dry.  Neurologic: CN 2-12 grossly intact with no focal deficits. Romberg sign and cerebellar reflexes not assessed.  Psychiatric: Normal judgment and insight. Alert and oriented x 3.   Data Reviewed: I have personally reviewed following labs and imaging studies  CBC: Recent Labs  Lab 06/15/24 1308 06/16/24 0446 06/17/24 0902  WBC 12.4* 14.7* 11.0*  NEUTROABS  --   --  7.8*  HGB 12.2 11.5* 10.4*  HCT 38.8 38.0 34.1*  MCV 89.0 90.9 90.7  PLT 271 246 277   Basic Metabolic Panel: Recent Labs  Lab 06/15/24 1308 06/15/24 1918 06/16/24 0446 06/17/24 0902  NA 136  --  136 136  K 3.2*  --  4.8 4.1  CL 95*  --  97* 97*  CO2 29  --  28 30  GLUCOSE 190*  --  124* 112*  BUN 10  --  12 10  CREATININE 0.72  --  0.96  0.79  CALCIUM 9.5  --  9.1 9.3  MG  --  2.0  --  2.4  PHOS  --   --   --  3.5   GFR: CrCl cannot be calculated (Unknown ideal weight.). Liver Function Tests: Recent Labs  Lab 06/15/24 1308 06/17/24 0902  AST 29 45*  ALT 31 35  ALKPHOS 84 79  BILITOT 0.3 0.2  PROT 8.5* 8.1  ALBUMIN 4.2 3.9   Recent Labs  Lab 06/15/24 1308  LIPASE 28   No results for input(s): AMMONIA in the last 168 hours. Coagulation Profile: No results for input(s): INR, PROTIME in the last 168 hours. Cardiac Enzymes: No results for input(s): CKTOTAL, CKMB, CKMBINDEX, TROPONINI in the last 168 hours. BNP (  last 3 results) No results for input(s): PROBNP in the last 8760 hours. HbA1C: No results for input(s): HGBA1C in the last 72 hours. CBG: No results for input(s): GLUCAP in the last 168 hours. Lipid Profile: No results for input(s): CHOL, HDL, LDLCALC, TRIG, CHOLHDL, LDLDIRECT in the last 72 hours. Thyroid  Function Tests: No results for input(s): TSH, T4TOTAL, FREET4, T3FREE, THYROIDAB in the last 72 hours. Anemia Panel: No results for input(s): VITAMINB12, FOLATE, FERRITIN, TIBC, IRON, RETICCTPCT in the last 72 hours. Sepsis Labs: Recent Labs  Lab 06/15/24 1633  LATICACIDVEN 1.1   No results found for this or any previous visit (from the past 240 hours).   Radiology Studies: No results found.  Scheduled Meds:  citalopram   30 mg Oral Daily   enoxaparin  (LOVENOX ) injection  40 mg Subcutaneous Q24H   metoprolol  succinate  25 mg Oral QHS   mirtazapine   15 mg Oral QHS   pantoprazole   40 mg Oral BID   Continuous Infusions:  cefTRIAXone  (ROCEPHIN )  IV 2 g (06/17/24 1722)   lactated ringers      metronidazole  500 mg (06/17/24 1050)    LOS: 2 days   Alejandro Marker, DO [facility] Available via Epic secure chat 7am-7pm After these hours, please refer to coverage provider listed on [email or web address] 06/17/2024, 8:43 PM

## 2024-06-17 NOTE — Progress Notes (Signed)
°   06/17/24 0910  TOC Brief Assessment  Insurance and Status Reviewed  Patient has primary care physician Yes  Home environment has been reviewed Apartment  Prior level of function: Independent  Prior/Current Home Services No current home services  Social Drivers of Health Review SDOH reviewed no interventions necessary  Readmission risk has been reviewed Yes  Transition of care needs no transition of care needs at this time    Signed: Heather Saltness, MSW, LCSW Clinical Social Worker Inpatient Care Management 06/17/2024 9:11 AM

## 2024-06-18 DIAGNOSIS — E876 Hypokalemia: Secondary | ICD-10-CM | POA: Diagnosis present

## 2024-06-18 LAB — COMPREHENSIVE METABOLIC PANEL WITH GFR
ALT: 38 U/L (ref 0–44)
AST: 43 U/L — ABNORMAL HIGH (ref 15–41)
Albumin: 3.9 g/dL (ref 3.5–5.0)
Alkaline Phosphatase: 111 U/L (ref 38–126)
Anion gap: 14 (ref 5–15)
BUN: 11 mg/dL (ref 6–20)
CO2: 27 mmol/L (ref 22–32)
Calcium: 9.4 mg/dL (ref 8.9–10.3)
Chloride: 97 mmol/L — ABNORMAL LOW (ref 98–111)
Creatinine, Ser: 0.74 mg/dL (ref 0.44–1.00)
GFR, Estimated: >60 mL/min (ref >60)
Glucose, Bld: 102 mg/dL — ABNORMAL HIGH (ref 70–99)
Potassium: 3.9 mmol/L (ref 3.5–5.1)
Sodium: 137 mmol/L (ref 135–145)
Total Bilirubin: 0.2 mg/dL (ref 0.0–1.2)
Total Protein: 8.3 g/dL — ABNORMAL HIGH (ref 6.5–8.1)

## 2024-06-18 LAB — CBC WITH DIFFERENTIAL/PLATELET
Abs Immature Granulocytes: 0.09 K/uL — ABNORMAL HIGH (ref 0.00–0.07)
Basophils Absolute: 0 K/uL (ref 0.0–0.1)
Basophils Relative: 0 %
Eosinophils Absolute: 0.3 K/uL (ref 0.0–0.5)
Eosinophils Relative: 3 %
HCT: 36.9 % (ref 36.0–46.0)
Hemoglobin: 11 g/dL — ABNORMAL LOW (ref 12.0–15.0)
Immature Granulocytes: 1 %
Lymphocytes Relative: 24 %
Lymphs Abs: 2.1 K/uL (ref 0.7–4.0)
MCH: 27.2 pg (ref 26.0–34.0)
MCHC: 29.8 g/dL — ABNORMAL LOW (ref 30.0–36.0)
MCV: 91.1 fL (ref 80.0–100.0)
Monocytes Absolute: 0.7 K/uL (ref 0.1–1.0)
Monocytes Relative: 8 %
Neutro Abs: 5.6 K/uL (ref 1.7–7.7)
Neutrophils Relative %: 64 %
Platelets: 258 K/uL (ref 150–400)
RBC: 4.05 MIL/uL (ref 3.87–5.11)
RDW: 14.8 % (ref 11.5–15.5)
WBC: 8.9 K/uL (ref 4.0–10.5)
nRBC: 0 % (ref 0.0–0.2)

## 2024-06-18 LAB — VITAMIN B12: Vitamin B-12: 485 pg/mL (ref 180–914)

## 2024-06-18 LAB — RETICULOCYTES
Immature Retic Fract: 32.9 % — ABNORMAL HIGH (ref 2.3–15.9)
RBC.: 4.05 MIL/uL (ref 3.87–5.11)
Retic Count, Absolute: 76.1 K/uL (ref 19.0–186.0)
Retic Ct Pct: 1.9 % (ref 0.4–3.1)

## 2024-06-18 LAB — IRON AND TIBC
Iron: 31 ug/dL (ref 28–170)
Saturation Ratios: 8 % — ABNORMAL LOW (ref 10.4–31.8)
TIBC: 391 ug/dL (ref 250–450)
UIBC: 359 ug/dL

## 2024-06-18 LAB — FERRITIN: Ferritin: 76 ng/mL (ref 11–307)

## 2024-06-18 LAB — MAGNESIUM: Magnesium: 2.5 mg/dL — ABNORMAL HIGH (ref 1.7–2.4)

## 2024-06-18 LAB — PHOSPHORUS: Phosphorus: 3.3 mg/dL (ref 2.5–4.6)

## 2024-06-18 LAB — FOLATE: Folate: 11.2 ng/mL (ref >5.9)

## 2024-06-18 MED ORDER — PANTOPRAZOLE SODIUM 40 MG PO TBEC: 40.0000 mg | DELAYED_RELEASE_TABLET | Freq: Two times a day (BID) | ORAL | 0 refills | Status: AC

## 2024-06-18 MED ORDER — MORPHINE SULFATE (PF) 2 MG/ML IV SOLN
2.0000 mg | INTRAVENOUS | Status: DC | PRN
  Administered 2024-06-18: 08:00:00 2 mg via INTRAVENOUS
  Filled 2024-06-18: qty 1

## 2024-06-18 MED ORDER — MIRTAZAPINE 15 MG PO TABS: 15.0000 mg | ORAL_TABLET | Freq: Every day | ORAL | 0 refills | Status: AC

## 2024-06-18 MED ORDER — POLYETHYLENE GLYCOL 3350 17 G PO PACK: 17.0000 g | PACK | Freq: Every day | ORAL | 0 refills | Status: AC | PRN

## 2024-06-18 NOTE — Assessment & Plan Note (Deleted)
 Hgb/Hct Trend dropped and went from 12.2/38.8 -> 11.5/38.0 -> 10.4/34.1. Check Anemia Panel in the AM; CTM for S/Sx of Bleeding; No overt bleeding noted. Repeat CBC in the AM

## 2024-06-18 NOTE — Assessment & Plan Note (Deleted)
 Stable. CTM Respiratory Status

## 2024-06-18 NOTE — Plan of Care (Signed)

## 2024-06-18 NOTE — Discharge Summary (Signed)
 Physician Discharge Summary   Patient: Carmen Ramos MRN: [id number] DOB: [birth year]  Admit date:     06/15/2024  Discharge date: 06/18/2024  Discharge Physician: Delon Herald   PCP: Boneta Sonna BRAVO, PA-C   Recommendations at discharge:   Complete antibiotics (Augmentin  twice daily to complete 10 total days) You are being prescribed a limited number of narcotic pain pills; take as directed and do not drive or make important decisions while taking this medication  Take Protonix  (pantoprazole ) twice daily (rather than omeprazole) Follow up with PA Fulbright in 1-2 weeks You are being referred to surgery since this is 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   On Ceftriaxone  and Metronidazole .  Assessment and Plan:  Assessment & Plan Acute diverticulitis Afebrile throughout hospitalization CT with acute uncomplicated diverticulitis Bowel rest initially but advanced to CLD -> Full Liquid -> SOFT Diet  DC with limited supply of oxycodone  IV Ceftriaxone  and Metronidazole  -> Augmentin  to complete 10 total days Continue Miralax  daily PRN Will refer to surgery for discussion about colectomy, as this is her 3rd episode; however, prior episodes were 5 and 8 years ago so this is not urgent HTN (hypertension) Resume Metoprolol  and Lisinopril -hydrochlorothiazide  Asthma, chronic, unspecified asthma severity, with acute  exacerbation Stable Anxiety Continue Citalopram   Take nightly temazepam and mirtazepine (refilled) Hypokalemia Resolved Will need monitoring with resumption of HCTZ Iron deficiency anemia due to chronic blood loss Hgb stable at 11 Normal anemia panel GERD without esophagitis Resume PPI w/ Pantoprazole  40 mg BID  Morbid obesity (HCC) Weight loss should be encouraged Outpatient PCP/bariatric medicine f/u encouraged Significantly low or high BMI is associated with higher medical risk including morbidity and mortality      Consultants: TOC team   Procedures: None   Antibiotics: Ceftriaxone  12/14- Metronidazole  12/14-   Pain control - [place]  Controlled Substance Reporting System database was reviewed. and patient was instructed, not to drive, operate heavy machinery, perform activities at heights, swimming or participation in water activities or provide baby-sitting services while on Pain, Sleep and Anxiety Medications; until their outpatient Physician has advised to do so again. Also recommended to not to take more than prescribed Pain, Sleep and Anxiety Medications.   Disposition: Home Diet recommendation:  Soft diet, advance as tolerated  DISCHARGE MEDICATION: Allergies as of 06/18/2024       Reactions   Ferumoxytol  Itching   Gluten Meal    Arthritis, IBS   Latex Rash        Medication List     STOP taking these medications    carboxymethylcellulose 0.5 % Soln Commonly known as: REFRESH PLUS   omeprazole 40 MG capsule Commonly known as: PRILOSEC   oxyCODONE -acetaminophen  5-325 MG tablet Commonly known as: PERCOCET/ROXICET   sucralfate  1 g tablet Commonly known as: Carafate    VITAMIN B-12 PO       TAKE these medications    acetaminophen  325 MG tablet Commonly known as: TYLENOL  Take 650 mg by mouth every 6 (six)  hours as needed for moderate pain.   amoxicillin -clavulanate 875-125 MG tablet Commonly known as: AUGMENTIN  Take 1 tablet by  mouth 2 (two) times daily for 7 days.   BLACK COHOSH MENOPAUSE COMPLEX PO Take 3,000 mg by mouth daily.   cetirizine 10 MG tablet Commonly known as: ZYRTEC Take 10 mg by mouth daily as needed for allergies.   citalopram  20 MG tablet Commonly known as: CELEXA  Take 30 mg by mouth daily.   GENISTEIN PO Take 125 mg by mouth daily.   lisinopril -hydrochlorothiazide  20-25 MG tablet Commonly known as: ZESTORETIC  Take 1 tablet by mouth daily.   metoprolol  succinate 25 MG 24 hr tablet Commonly known as: TOPROL -XL Take 25 mg by mouth daily.   mirtazapine  15 MG tablet Commonly known as: REMERON  Take 1 tablet (15 mg total) by mouth at bedtime. What changed: See the new instructions.   oxyCODONE  5 MG immediate release tablet Commonly known as: Oxy IR/ROXICODONE  Take 1 tablet (5 mg total) by mouth every 6 (six) hours as needed for moderate pain (pain score 4-6) or breakthrough pain.   pantoprazole  40 MG tablet Commonly known as: PROTONIX  Take 1 tablet (40 mg total) by mouth 2 (two) times daily before a meal.   polyethylene glycol 17 g packet Commonly known as: MIRALAX  / GLYCOLAX  Take 17 g by mouth daily as needed for mild constipation.   temazepam 15 MG capsule Commonly known as: RESTORIL Take 15 mg by mouth at bedtime as needed for sleep.        Follow-up Information     Fulbright, [place]  E, PA-C. Schedule an appointment as soon as possible for a visit in 1 week(s).   Specialty: Family Medicine Contact information: [address] [place] [address] [phone]         Surgery, [place]. Schedule an appointment as soon as possible for a visit.   Specialty: General Surgery Contact information: [address] [place] [address] [phone]                Discharge Exam:   Subjective: Pain is improving, advancing diet, feels better and suddenly very eager to go home.   Objective: Vitals:   06/18/24 0540  06/18/24 1215  BP: (!) 118/59 (!) 152/77  Pulse: 60 62  Resp: 20 18  Temp: 98.3 F (36.8 C) 98.2 F (36.8 C)  SpO2:  90%    Intake/Output Summary (Last 24 hours) at 06/18/2024 1500 Last data filed at 06/17/2024 2310 Gross per 24 hour  Intake --  Output 950 ml  Net -950 ml   There were no vitals filed for this visit.  Exam:  General:  Appears calm and comfortable and is in NAD Eyes:  normal lids, iris ENT:  grossly normal hearing, lips & tongue, mmm Cardiovascular:  RRR. No LE edema.  Respiratory:   CTA bilaterally with no wheezes/rales/rhonchi.  Normal respiratory effort. Abdomen:  soft, mild epigastric and BLQ TTP, ND Skin:  no rash or induration seen on limited exam Musculoskeletal:  grossly normal tone BUE/BLE, good ROM, no bony abnormality Psychiatric:  grossly normal mood and affect, speech fluent and appropriate, AOx3 Neurologic:  CN 2-12 grossly intact, moves all extremities in coordinated fashion  Data Reviewed: I have reviewed the patient's lab results since admission.  Pertinent labs for today include:   None today    Condition at discharge: improving  The results of significant diagnostics from this hospitalization (including imaging, microbiology, ancillary and laboratory) are  listed below for reference.   Imaging Studies: CT ABDOMEN PELVIS W CONTRAST Result Date: 06/15/2024 CLINICAL DATA:  Diverticulitis, complication suspected EXAM: CT ABDOMEN AND PELVIS WITH CONTRAST TECHNIQUE: Multidetector CT imaging of the abdomen and pelvis was performed using the standard protocol following bolus administration of intravenous contrast. RADIATION DOSE REDUCTION: This exam was performed according to the departmental dose-optimization program which includes automated exposure control, adjustment of the mA and/or kV according to patient size and/or use of iterative reconstruction technique. CONTRAST:  OMNIPAQUE  IOHEXOL  300 MG/ML  SOLN COMPARISON:  Nov 13, 2020  FINDINGS: Lower chest: LEFT basilar atelectasis. Hepatobiliary: Profound hepatic steatosis. Focal fatty sparing along the gallbladder fossa and falciform gallbladder is unremarkable. No extrahepatic biliary ductal dilation. Hepatomegaly. Pancreas: Unremarkable. No pancreatic ductal dilatation or surrounding inflammatory changes. Spleen: Normal in size without focal abnormality. Adrenals/Urinary Tract: Adrenal glands are unremarkable. Kidneys are normal, without renal calculi, focal lesion, or hydronephrosis. Bladder is unremarkable. Stomach/Bowel: No evidence of bowel obstruction. In the sigmoid colon, there is a prominent diverticula as well as circumferential, wall thickening with exuberant adjacent fat stranding. No definitive free air or focal drainable fluid collection. Appendix is normal. Large hiatal hernia. Vascular/Lymphatic: No significant vascular findings are present. No enlarged abdominal or pelvic lymph nodes. Reproductive: Status post hysterectomy. No adnexal masses. Other: Fat containing periumbilical hernia. Musculoskeletal: Facet arthropathy of the lumbar spine. IMPRESSION: 1. Acute uncomplicated sigmoid diverticulitis. 2. Profound hepatic steatosis with hepatomegaly. 3. Large hiatal hernia. Electronically Signed   By: Corean Salter M.D.   On: 06/15/2024 17:59    Microbiology: Results for orders placed or performed during the hospital encounter of 11/13/20  SARS CORONAVIRUS 2 (TAT 6-24 HRS) Nasopharyngeal Nasopharyngeal Swab     Status: None   Collection Time: 11/14/20  1:57 AM   Specimen: Nasopharyngeal Swab  Result Value Ref Range Status   SARS Coronavirus 2 NEGATIVE NEGATIVE Final    Comment: (NOTE) SARS-CoV-2 target nucleic acids are NOT DETECTED.  The SARS-CoV-2 RNA is generally detectable in upper and lower respiratory specimens during the acute phase of infection. Negative results do not preclude SARS-CoV-2 infection, do not rule out co-infections with other pathogens,  and should not be used as the sole basis for treatment or other patient management decisions. Negative results must be combined with clinical observations, patient history, and epidemiological information. The expected result is Negative.  Fact Sheet for Patients: hairslick.no  Fact Sheet for Healthcare Providers: quierodirigir.com  This test is not yet approved or cleared by the [place]  FDA and  has been authorized for detection and/or diagnosis of SARS-CoV-2 by FDA under an Emergency Use Authorization (EUA). This EUA will remain  in effect (meaning this test can be used) for the duration of the COVID-19 declaration under Se ction 564(b)(1) of the Act, 21 U.S.C. section 360bbb-3(b)(1), unless the authorization is terminated or revoked sooner.  Performed at [facility], [address], [place], [place] [address]     Labs: CBC: Recent Labs  Lab 06/15/24 1308 06/16/24 0446 06/17/24 0902 06/18/24 0558  WBC 12.4* 14.7* 11.0* 8.9  NEUTROABS  --   --  7.8* 5.6  HGB 12.2 11.5* 10.4* 11.0*  HCT 38.8 38.0 34.1* 36.9  MCV 89.0 90.9 90.7 91.1  PLT 271 246 277 258   Basic Metabolic Panel: Recent Labs  Lab 06/15/24 1308 06/15/24 1918 06/16/24 0446 06/17/24 0902 06/18/24 0558  NA 136  --  136 136 137  K 3.2*  --  4.8 4.1  3.9  CL 95*  --  97* 97* 97*  CO2 29  --  28 30 27   GLUCOSE 190*  --  124* 112* 102*  BUN 10  --  12 10 11   CREATININE 0.72  --  0.96 0.79 0.74  CALCIUM 9.5  --  9.1 9.3 9.4  MG  --  2.0  --  2.4 2.5*  PHOS  --   --   --  3.5 3.3   Liver Function Tests: Recent Labs  Lab 06/15/24 1308 06/17/24 0902 06/18/24 0558  AST 29 45* 43*  ALT 31 35 38  ALKPHOS 84 79 111  BILITOT 0.3 0.2 0.2  PROT 8.5* 8.1 8.3*  ALBUMIN 4.2 3.9 3.9   CBG: No results for input(s): GLUCAP in the last 168 hours.  Discharge time spent: greater than 30 minutes.  Signed: Delon Herald, MD [facility] 06/18/2024

## 2024-06-18 NOTE — Assessment & Plan Note (Deleted)
 Resume Citalopram  30 mg po Daily

## 2024-06-18 NOTE — Assessment & Plan Note (Signed)
 Stable

## 2024-06-18 NOTE — Assessment & Plan Note (Deleted)
 Resume Metoprolol ; Hold Lisinopril -hydrochlorothiazide . CTM BP per Protocol Last BP reading was 128/65

## 2024-06-18 NOTE — Assessment & Plan Note (Signed)
 Afebrile throughout hospitalization CT with acute uncomplicated diverticulitis Bowel rest initially but advanced to CLD -> Full Liquid -> SOFT Diet  DC with limited supply of oxycodone  IV Ceftriaxone  and Metronidazole  -> Augmentin  to complete 10 total days Continue Miralax  daily PRN Will refer to surgery for discussion about colectomy, as this is her 3rd episode; however, prior episodes were 5 and 8 years ago so this is not urgent

## 2024-06-18 NOTE — Assessment & Plan Note (Signed)
 Resume Metoprolol  and Lisinopril -hydrochlorothiazide 

## 2024-06-18 NOTE — Assessment & Plan Note (Signed)
 Weight loss should be encouraged Outpatient PCP/bariatric medicine f/u encouraged Significantly low or high BMI is associated with higher medical risk including morbidity and mortality

## 2024-06-18 NOTE — Assessment & Plan Note (Signed)
 Continue Citalopram   Take nightly temazepam and mirtazepine (refilled)

## 2024-06-18 NOTE — Assessment & Plan Note (Deleted)
 Afebrile.  CTAP WC acute uncomplicated diverticulitis, status post hepatomegaly. -Bowel rest initially but advanced to CLD -> Full Liquid; Still having Sx so will remain on FULL Liquid today and will try to advance to SOFT Diet tomorrow  -IV Morphine  4 mg every 4 hourly as needed changed to q3hprn and added po Oxycodone  IR 5 mg po q6h -IV Ceftriaxone  and Metronidazole  -Renew IVF w/ LR @ 75 mL/hr x 1 Day -Supportive Care and C/w po/IV Zofran  and add IV Compazine  10 mg q6hprn for Refractory N/V -C/w Miralax  Daily PRN -WBC went from 12.4 -> 14.7 -> 11.0 -Will need Surgical F/u in the outpatient setting once she recovers as this is the 3rd episode of Diverticulitis

## 2024-06-18 NOTE — Assessment & Plan Note (Deleted)
 Potassium is now improved to 4.1.  SHe is on Lisinopril -Hydrochlorothiazide  which will be held. Stopped IVF containing KCL and start LR as above

## 2024-06-18 NOTE — Assessment & Plan Note (Deleted)
 There is no height or weight on file to calculate BMI..  Weight loss should be encouraged Outpatient PCP/bariatric medicine f/u encouraged Significantly low or high BMI is associated with higher medical risk including morbidity and mortality

## 2024-06-18 NOTE — Assessment & Plan Note (Signed)
 Resolved Will need monitoring with resumption of HCTZ

## 2024-06-18 NOTE — Plan of Care (Signed)

## 2024-06-18 NOTE — Assessment & Plan Note (Signed)
 Resume PPI w/ Pantoprazole  40 mg BID

## 2024-06-18 NOTE — Assessment & Plan Note (Deleted)
 Resume PPI w/ Pantoprazole  40 mg BID

## 2024-06-18 NOTE — Assessment & Plan Note (Signed)
 Hgb stable at 11 Normal anemia panel
